# Patient Record
Sex: Female | Born: 1935 | Race: White | Hispanic: No | Marital: Married | Smoking: Former smoker
Health system: Southern US, Community
[De-identification: ages and names within clinical notes are randomized; demographics above are authoritative.]

## PROBLEM LIST (undated history)

## (undated) DIAGNOSIS — R112 Nausea with vomiting, unspecified: Secondary | ICD-10-CM

## (undated) DIAGNOSIS — Z8719 Personal history of other diseases of the digestive system: Secondary | ICD-10-CM

## (undated) DIAGNOSIS — M545 Low back pain, unspecified: Secondary | ICD-10-CM

## (undated) DIAGNOSIS — R269 Unspecified abnormalities of gait and mobility: Secondary | ICD-10-CM

## (undated) DIAGNOSIS — M797 Fibromyalgia: Secondary | ICD-10-CM

## (undated) DIAGNOSIS — J069 Acute upper respiratory infection, unspecified: Secondary | ICD-10-CM

## (undated) DIAGNOSIS — E669 Obesity, unspecified: Secondary | ICD-10-CM

## (undated) DIAGNOSIS — M1990 Unspecified osteoarthritis, unspecified site: Secondary | ICD-10-CM

## (undated) DIAGNOSIS — E7800 Pure hypercholesterolemia, unspecified: Secondary | ICD-10-CM

## (undated) DIAGNOSIS — C801 Malignant (primary) neoplasm, unspecified: Secondary | ICD-10-CM

## (undated) DIAGNOSIS — I10 Essential (primary) hypertension: Secondary | ICD-10-CM

## (undated) DIAGNOSIS — R0602 Shortness of breath: Secondary | ICD-10-CM

## (undated) DIAGNOSIS — G2581 Restless legs syndrome: Secondary | ICD-10-CM

## (undated) DIAGNOSIS — G8929 Other chronic pain: Secondary | ICD-10-CM

## (undated) DIAGNOSIS — Z9889 Other specified postprocedural states: Secondary | ICD-10-CM

## (undated) DIAGNOSIS — R29898 Other symptoms and signs involving the musculoskeletal system: Principal | ICD-10-CM

## (undated) DIAGNOSIS — E119 Type 2 diabetes mellitus without complications: Secondary | ICD-10-CM

## (undated) DIAGNOSIS — G4730 Sleep apnea, unspecified: Secondary | ICD-10-CM

## (undated) HISTORY — PX: JOINT REPLACEMENT: SHX530

## (undated) HISTORY — DX: Fibromyalgia: M79.7

## (undated) HISTORY — DX: Unspecified abnormalities of gait and mobility: R26.9

## (undated) HISTORY — PX: BREAST CYST EXCISION: SHX579

## (undated) HISTORY — PX: VAGINAL HYSTERECTOMY: SUR661

## (undated) HISTORY — DX: Malignant (primary) neoplasm, unspecified: C80.1

## (undated) HISTORY — PX: REPLACEMENT TOTAL KNEE BILATERAL: SUR1225

## (undated) HISTORY — PX: CARPAL TUNNEL RELEASE: SHX101

## (undated) HISTORY — PX: CHOLECYSTECTOMY: SHX55

## (undated) HISTORY — DX: Other symptoms and signs involving the musculoskeletal system: R29.898

## (undated) HISTORY — PX: BREAST LUMPECTOMY: SHX2

## (undated) HISTORY — DX: Essential (primary) hypertension: I10

## (undated) HISTORY — DX: Obesity, unspecified: E66.9

## (undated) HISTORY — PX: ROTATOR CUFF REPAIR: SHX139

---
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
## 1995-11-16 HISTORY — PX: BREAST EXCISIONAL BIOPSY: SUR124

## 1996-11-15 HISTORY — PX: BREAST EXCISIONAL BIOPSY: SUR124

## 2000-08-01 ENCOUNTER — Encounter: Admission: RE | Admit: 2000-08-01 | Discharge: 2000-08-01 | Payer: Self-pay | Admitting: Geriatric Medicine

## 2000-08-01 ENCOUNTER — Encounter: Payer: Self-pay | Admitting: Geriatric Medicine

## 2001-08-08 ENCOUNTER — Encounter: Admission: RE | Admit: 2001-08-08 | Discharge: 2001-08-08 | Payer: Self-pay | Admitting: Geriatric Medicine

## 2001-08-08 ENCOUNTER — Encounter: Payer: Self-pay | Admitting: Geriatric Medicine

## 2002-12-12 ENCOUNTER — Encounter: Payer: Self-pay | Admitting: Specialist

## 2002-12-19 ENCOUNTER — Encounter: Payer: Self-pay | Admitting: Specialist

## 2002-12-19 ENCOUNTER — Inpatient Hospital Stay (HOSPITAL_COMMUNITY): Admission: RE | Admit: 2002-12-19 | Discharge: 2002-12-24 | Payer: Self-pay | Admitting: Specialist

## 2003-11-20 ENCOUNTER — Encounter: Admission: RE | Admit: 2003-11-20 | Discharge: 2003-11-20 | Payer: Self-pay | Admitting: Geriatric Medicine

## 2003-11-26 ENCOUNTER — Encounter: Admission: RE | Admit: 2003-11-26 | Discharge: 2003-11-26 | Payer: Self-pay | Admitting: Rheumatology

## 2004-09-30 ENCOUNTER — Encounter: Admission: RE | Admit: 2004-09-30 | Discharge: 2004-12-29 | Payer: Self-pay | Admitting: Geriatric Medicine

## 2005-02-10 ENCOUNTER — Encounter: Admission: RE | Admit: 2005-02-10 | Discharge: 2005-02-10 | Payer: Self-pay | Admitting: Geriatric Medicine

## 2005-08-25 ENCOUNTER — Ambulatory Visit (HOSPITAL_COMMUNITY): Admission: RE | Admit: 2005-08-25 | Discharge: 2005-08-25 | Payer: Self-pay | Admitting: Gastroenterology

## 2007-10-16 HISTORY — PX: SHOULDER OPEN ROTATOR CUFF REPAIR: SHX2407

## 2007-10-25 ENCOUNTER — Ambulatory Visit (HOSPITAL_COMMUNITY): Admission: RE | Admit: 2007-10-25 | Discharge: 2007-10-26 | Payer: Self-pay | Admitting: Specialist

## 2010-01-14 ENCOUNTER — Inpatient Hospital Stay (HOSPITAL_COMMUNITY): Admission: RE | Admit: 2010-01-14 | Discharge: 2010-01-17 | Payer: Self-pay | Admitting: Specialist

## 2010-11-15 HISTORY — PX: CATARACT EXTRACTION W/ INTRAOCULAR LENS  IMPLANT, BILATERAL: SHX1307

## 2011-02-03 LAB — CBC
HCT: 37.8 % (ref 36.0–46.0)
Hemoglobin: 12.7 g/dL (ref 12.0–15.0)
MCHC: 33.6 g/dL (ref 30.0–36.0)
RDW: 15.1 % (ref 11.5–15.5)

## 2011-02-03 LAB — URINALYSIS, ROUTINE W REFLEX MICROSCOPIC
Glucose, UA: NEGATIVE mg/dL
Hgb urine dipstick: NEGATIVE
Protein, ur: NEGATIVE mg/dL
Specific Gravity, Urine: 1.009 (ref 1.005–1.030)
Urobilinogen, UA: 0.2 mg/dL (ref 0.0–1.0)
pH: 6.5 (ref 5.0–8.0)

## 2011-02-03 LAB — COMPREHENSIVE METABOLIC PANEL
AST: 19 U/L (ref 0–37)
BUN: 20 mg/dL (ref 6–23)
CO2: 30 mEq/L (ref 19–32)
GFR calc Af Amer: 52 mL/min — ABNORMAL LOW (ref >60)
Glucose, Bld: 94 mg/dL (ref 70–99)

## 2011-02-03 LAB — TYPE AND SCREEN: ABO/RH(D): A POS

## 2011-02-03 LAB — DIFFERENTIAL
Basophils Absolute: 0 10*3/uL (ref 0.0–0.1)
Eosinophils Absolute: 0.1 10*3/uL (ref 0.0–0.7)
Eosinophils Relative: 2 % (ref 0–5)
Lymphs Abs: 1.9 10*3/uL (ref 0.7–4.0)
Monocytes Absolute: 0.6 10*3/uL (ref 0.1–1.0)
Monocytes Relative: 7 % (ref 3–12)
Neutro Abs: 5.8 10*3/uL (ref 1.7–7.7)
Neutrophils Relative %: 69 % (ref 43–77)

## 2011-02-03 LAB — APTT: aPTT: 27 seconds (ref 24–37)

## 2011-02-07 LAB — GLUCOSE, CAPILLARY
Glucose-Capillary: 102 mg/dL — ABNORMAL HIGH (ref 70–99)
Glucose-Capillary: 108 mg/dL — ABNORMAL HIGH (ref 70–99)
Glucose-Capillary: 127 mg/dL — ABNORMAL HIGH (ref 70–99)
Glucose-Capillary: 133 mg/dL — ABNORMAL HIGH (ref 70–99)
Glucose-Capillary: 146 mg/dL — ABNORMAL HIGH (ref 70–99)

## 2011-02-07 LAB — BASIC METABOLIC PANEL
BUN: 10 mg/dL (ref 6–23)
CO2: 31 mEq/L (ref 19–32)
Calcium: 8.7 mg/dL (ref 8.4–10.5)
Calcium: 9 mg/dL (ref 8.4–10.5)
Calcium: 9.6 mg/dL (ref 8.4–10.5)
Chloride: 102 mEq/L (ref 96–112)
Chloride: 103 mEq/L (ref 96–112)
GFR calc Af Amer: 59 mL/min — ABNORMAL LOW (ref >60)
GFR calc Af Amer: >60 mL/min (ref >60)
GFR calc non Af Amer: 49 mL/min — ABNORMAL LOW (ref >60)
GFR calc non Af Amer: 52 mL/min — ABNORMAL LOW (ref >60)
GFR calc non Af Amer: 59 mL/min — ABNORMAL LOW (ref >60)
Glucose, Bld: 133 mg/dL — ABNORMAL HIGH (ref 70–99)
Glucose, Bld: 149 mg/dL — ABNORMAL HIGH (ref 70–99)
Potassium: 4.4 mEq/L (ref 3.5–5.1)
Sodium: 137 mEq/L (ref 135–145)

## 2011-02-07 LAB — CBC
HCT: 35.2 % — ABNORMAL LOW (ref 36.0–46.0)
HCT: 35.3 % — ABNORMAL LOW (ref 36.0–46.0)
Hemoglobin: 11.2 g/dL — ABNORMAL LOW (ref 12.0–15.0)
Hemoglobin: 12 g/dL (ref 12.0–15.0)
MCV: 83.3 fL (ref 78.0–100.0)
MCV: 83.9 fL (ref 78.0–100.0)
Platelets: 172 10*3/uL (ref 150–400)
RBC: 4.14 MIL/uL (ref 3.87–5.11)
RDW: 14.9 % (ref 11.5–15.5)
RDW: 15 % (ref 11.5–15.5)
WBC: 10 10*3/uL (ref 4.0–10.5)
WBC: 11.9 10*3/uL — ABNORMAL HIGH (ref 4.0–10.5)
WBC: 9.3 10*3/uL (ref 4.0–10.5)

## 2011-02-07 LAB — PROTIME-INR
INR: 1.09 (ref 0.00–1.49)
INR: 1.72 — ABNORMAL HIGH (ref 0.00–1.49)
INR: 2.3 — ABNORMAL HIGH (ref 0.00–1.49)
Prothrombin Time: 14 seconds (ref 11.6–15.2)
Prothrombin Time: 20 seconds — ABNORMAL HIGH (ref 11.6–15.2)

## 2011-03-30 NOTE — Op Note (Signed)
NAMEPECOLA, HAXTON                ACCOUNT NO.:  [id number]   MEDICAL RECORD NO.:  [id number]          PATIENT TYPE:  AMB   LOCATION:  DAY                          FACILITY:  [facility]   PHYSICIAN:  Jene Every, M.D.    DATE OF BIRTH:  04-29-1936   DATE OF PROCEDURE:  10/25/2007  DATE OF DISCHARGE:                               OPERATIVE REPORT   PREOPERATIVE DIAGNOSES:  1. Recurrent rotator cuff tear.  2. Glenohumeral arthrosis of the right shoulder.   POSTOPERATIVE DIAGNOSES:  1. Recurrent rotator cuff tear.  2. Glenohumeral arthrosis of the right shoulder.   PROCEDURES PERFORMED:  1. Redo open repair rotator cuff tear.  2. Lavage of the glenohumeral joint.  3. Acromioplasty.  4. Excision of scar tissue and revision of previous surgical scar.   ANESTHESIA:  General.   ASSISTANT:  Roma Schanz, P.A.   BRIEF HISTORY AND INDICATIONS:  A 75 year old with recurrent tear of the  rotator cuff noted by MRI with positive impingement sign and pain  refractory to conservative treatment.  She was indicated for redo repair  of the rotator cuff.  She has also had a distal clavicle resection in  the past.  The risks and benefits discussed, including bleeding,  infection, suboptimal range of motion, recurrent tear, need for  prolonged immobilization, perioperative complications, etc.   TECHNIQUE:  The patient supine in beach-chair position.  After induction  of adequate anesthesia and 2 g Kefzol, the right upper extremity was  prepped and draped in the usual sterile fashion.  Previous surgical  incision, which was lateral, was utilized and excised the previous  hypertrophic scar.  Subcutaneous tissues were dissected.  Electrocautery  was utilized to achieve hemostasis.  The raphe was within anterolateral  heads of the deltoid was identified and divided approximately 2 cm from  the anterolateral aspect of the acromion and subperiosteally elevated  laterally and slightly medially,  preserving the remainder of the deltoid  attachment.  Significant hypertrophic scar tissue and bursitis were  noted.  This was excised utilizing the Metzenbaums.  A tear of the  supraspinatus was noted longitudinal  to its insertion with degenerated  tendon as well.  This was elliptically excised.  It was debrided and a  small  area of decortication of the bone was performed a Investment banker, operational.  Following this, I copiously lavaged the glenohumeral joint, rotating the  arm and expressing all fluid utilized.  Good bleeding ends of the tissue  were noted.  It was attached distally and repaired side-to-side with #1  Vicryl interrupted figure-of-eight sutures.  The subcutaneous tissue was  reapproximated with 2-0 simple sutures.  The #1 Vicryl was utilized. To  repair the raphe through and to the acromion.  The skin was  reapproximated with 4-0 subcuticular Prolene.  The wound was reinforced  with Steri-Strips, sterile dressing applied.  I ranged the shoulder  prior to final closure and there was no  tension upon that  repair site.  Following extubation, she was placed in  the sling, returned to the recovery room in satisfactory condition.   The patient  tolerated the procedure well.  There were no complications.      Jene Every, M.D.  Electronically Signed     JB/MEDQ  D:  10/25/2007  T:  10/25/2007  Job:  [id number]

## 2011-04-02 NOTE — Discharge Summary (Signed)
[name], [name]                          ACCOUNT NO.:  [id number]   MEDICAL RECORD NO.:  [id number]                   PATIENT TYPE:  INP   LOCATION:  0481                                 FACILITY:  [facility]   PHYSICIAN:  Jene Every, M.D.                 DATE OF BIRTH:  June 21, 1936   DATE OF ADMISSION:  12/19/2002  DATE OF DISCHARGE:  12/24/2002                                 DISCHARGE SUMMARY   ADMISSION DIAGNOSES:  1. Degenerative joint disease left knee.  2. Diabetes mellitus type 2.  3. Hypertension.  4. Hypercholesterolemia.  5. Osteoarthritis.   DISCHARGE DIAGNOSES:  1. Degenerative joint disease left knee status post left total knee     arthroplasty.  2. Diabetes mellitus type 2.  3. Hypertension.  4. Hypercholesterolemia.  5. Osteoarthritis.   PROCEDURE:  The patient was taken to the OR on December 19, 2002 to undergo a  left total knee arthroplasty by Jene Every, M.D.  Assistant Roma Schanz, P.A.  Anesthesia:  General.  Complications:  None.  At the time of  surgery one Hemovac drain was placed.   CONSULTS:  PT/OT.   HISTORY:  The patient is a 75 year old female with long-standing history of  left knee pain.  Was described very debilitating over the past several  months.  The patient has tried multiple conservative treatments including  arthroscopic surgery, corticosteroid injection, anti-inflammatories, and has  not gotten significant relief from these.  Due to the patient's physical  findings and failing conservative treatment, it is felt she would benefit  from a total knee arthroplasty.  The risks and benefits of the surgery were  discussed with the patient by Jene Every, M.D. and she wished to proceed.   LABORATORIES:  Pre admission CBC shows a white blood cell count of 7.4,  hemoglobin 12.8, hematocrit 37.0.  Serial hemoglobin and hematocrit were  followed throughout the hospital course.  The patient did drop to a level of  10.3,  hematocrit 30.0 at time of discharge, but remained asymptomatic.  Differential were all within normal limits.  PT/INR was followed.  Pre  admission PT was 13.2, INR 1.0.  At the time of discharge the PT/INR was  therapeutic.  PT 22.3, INR 2.2.  Routine chemistries were also done  preoperatively showed sodium 143, potassium 4.1, glucose 122, BUN and  creatinine normal.  At time of discharge patient's chemistries remained  within normal limits with sodium 140, potassium 3.6, glucose 136, slightly  decreased in her calcium to 8.3.  Urinalysis done preoperatively shows  cloudy urine with large amount of leukocyte esterase, many epithelials, 11-  20 wbc's, serum microscopic examination with many bacteria.  Several  urinalyses were performed throughout the patient's hospital course which  continued to show moderate leukocyte esterase with many epithelials with  only 3-6 wbc's seen on microscopic examination.  The patient remained  symptomatic from these  urinary results.  Blood type is A+.  Urine culture  was performed during hospitalization which showed no growth.  Pre admission  EKG showed normal sinus rhythm with anterior infarct of age undetermined.  Chest x-ray shows cardiomegaly with no active cardiopulmonary disease noted.  Preoperative films of the knee show a medial compartment osteoarthritis.   HOSPITAL COURSE:  The patient was subsequently admitted to [facility].  Underwent the above stated procedure without difficulty.  During  surgery one Hemovac drain was placed.  The patient was transferred to the  PACU and then to the orthopedic floor for continued postoperative care.  The  patient was placed on PC analgesics for pain control.  PT/OT was ordered  with ambulation as tolerated.  Postoperative day number one the patient was  doing fairly well.  She did note some mild nausea but this was relieved with  antiemetics.  Motor and neurovascular function remained intact.  Hemovac  was  continued.  The patient had 100 mL of drainage.  Coumadin was initiated per  the pharmacy for DVT prophylaxis.  PT/OT were consulted.  CPM was initiated.  Postoperative day number two pain was well controlled on p.o. medications.  The patient's Foley/PCA were discontinued.  IV was changed to Hep-Lock.  Urinalysis was repeated.  However, the patient remained asymptomatic.  Dressing was changed.  Incision was clean and dry.  Hemovac was removed  without difficulty.  Rehabilitation consult was requested.  However, no beds  were available.  Postoperative day number three patient was requesting to be  discharged.  She was advancing well with PT.  Repeat urinalysis was  performed for a clean catch.  This showed only moderate leukocyte esterase  with only a few wbc's seen on microscopic examination.  Postoperative day  number five the patient was doing fairly well.  She was advancing very well  with physical therapy who felt she was ready to be discharged home.  Discharge planning had been initiated with all her home health needed.  Hemoglobin was at 10.3, hematocrit 30.0.  PT/INR was therapeutic at 22.3,  2.2.  Urine culture was negative.  Incision remained clean and dry without  evidence of infection.  Pain was well controlled on p.o. analgesics.  At  this point it was felt that patient could be discharged home.   DISCHARGE INSTRUCTIONS:  Follow up with Jene Every, M.D. in 10-14 days.  Call the office for an appointment.  She should change her dressing daily.  She can shower.  Ambulation as tolerated.  Knee immobilizer at all times  until patient can straight leg raise.   DISCHARGE MEDICATIONS:  1. Percocet 5/325 one to two p.o. q.4-6h. p.r.n. pain.  2. Robaxin 500 mg one p.o. q.8h. p.r.n. spasm.  3. Coumadin per the pharmacy.   CONDITION ON DISCHARGE:  Stable.   FINAL DIAGNOSES:  Status post left total knee arthroplasty.     Roma Schanz, P.A.                   Jene Every, M.D.    CS/MEDQ  D:  01/16/2003  T:  01/16/2003  Job:  [id number]

## 2011-04-02 NOTE — H&P (Signed)
NAME:  Kayla, Crawford                           ACCOUNT NO.:  [id number]   MEDICAL RECORD NO.:  [id number]                    PATIENT TYPE:   LOCATION:                                       FACILITY:   PHYSICIAN:  Jene Every, M.D.                 DATE OF BIRTH:  09/30/1936   DATE OF ADMISSION:  12/19/2002  DATE OF DISCHARGE:                                HISTORY & PHYSICAL   CHIEF COMPLAINT:  Left knee pain.   HISTORY OF PRESENT ILLNESS:  Ms. [name] is a 75 year old female who has a long  standing history of left knee pain that has gotten to the point where it is  very debilitating. The patient has tried conservative treatment such as  arthroscopic surgery times two, corticosteroid injections, anti-  inflammatories and has not gotten significant relief from any of these. So  it is felt that at this time, the patient would benefit from a total knee  arthroplasty due to the escalation of her symptoms and it becoming  unbearable. The risks and benefits of this surgery were discussed with the  patient by Dr. Jene Every and she wishes to proceed.   PAST MEDICAL HISTORY:  Significant for diabetes mellitus type II,  hypertension, hypercholesterolemia, osteoarthritis.   PAST SURGICAL HISTORY:  Significant for appendectomy, cholecystectomy,  partial hysterectomy, bilateral knee scope, right rotator cuff repair.   CURRENT MEDICATIONS:  1. Avandia 8 mg one by mouth each day.  2. Toprol XL 50 mg one by mouth each day.  3. Lisinopril 20 mg one by mouth twice a day.  4. Lipitor 40 mg one by mouth each day.  5. Mobic one by mouth each day.  6. Ultram as needed pain.   ALLERGIES:  No known drug allergies.   SOCIAL HISTORY:  The patient is married. She smokes about 1 pack per day of  cigarettes. She denies any alcohol intake. Her husband will be her caregiver  following surgery. They live in a one story home. Primary care physician is  Dr. Merlene Laughter.   FAMILY HISTORY:   Noncontributory. The patient was adopted as a child.   REVIEW OF SYSTEMS:  General: The patient denies fever, chills, night sweats,  or bleeding tendencies. CNS: No blurred, double vision, seizure, headache,  or paralysis. Respiratory: No shortness of breath, cough, or hemoptysis.  Cardiovascular: No angina or orthopnea. GI: No nausea, vomiting, diarrhea,  constipation, melena, blood in stools. GU: No dysuria, hematuria or  discharge. MS: Is pertinent as per HPI.   PHYSICAL EXAMINATION:  VITAL SIGNS: Pulse 88, respiratory rate 18, blood  pressure 138/96.  GENERAL: A well developed, well nourished 75 year old female in no acute  distress.  HEENT: Atraumatic, normocephalic. Pupils are equal, round, and reactive to  light and accommodation. Extraocular muscles intact.  NECK: Supple. No lymphadenopathy. No carotid bruits are appreciated.  CHEST: Clear to auscultation and  percussion bilaterally. No rhonchi,  wheezes, or rales.  HEART: Regular rate and rhythm. No murmur, rub, or gallop.  ABDOMEN: Soft, nontender, and nondistended. Bowel sounds times four.  BREASTS: Not examined.  GU: Not examined. Not pertinent to HPI.  EXTREMITIES: Examination of left knee along medial joint line. She has a  range of motion of 0-100 degrees.   DIAGNOSTIC IMPRESSION:  X-ray's of the left knee show medial joint space  narrowing.   IMPRESSION:  1. Degenerative joint disease of the left knee.  2. Diabetes mellitus type II.  3. Hypertension.  4. Hypercholesterolemia.  5. Osteoarthritis.   PLAN:  The patient will be admitted to [facility] on  December 19, 2002 to undergo a left total knee arthroplasty pending medical  clearance by Dr. Pete Glatter.      Roma Schanz, P.A.                   Jene Every, M.D.    CS/MEDQ  D:  12/13/2002  T:  12/13/2002  Job:  [id number]   cc:   Jene Every, M.D.  [address]  [place]  [place] [address]  Fax: [phone]   Hal T. Stoneking,  M.D.  [address] [place], [place] [address]  Fax: [phone]

## 2011-04-02 NOTE — Op Note (Signed)
NAMERYKER, PHERIGO                          ACCOUNT NO.:  [id number]   MEDICAL RECORD NO.:  [id number]                   PATIENT TYPE:  INP   LOCATION:  X001                                 FACILITY:  [facility]   PHYSICIAN:  Jene Every, M.D.                 DATE OF BIRTH:  1936-10-11   DATE OF PROCEDURE:  12/19/2002  DATE OF DISCHARGE:                                 OPERATIVE REPORT   PREOPERATIVE DIAGNOSIS:  Degenerative joint disease, left knee.   POSTOPERATIVE DIAGNOSIS:  Degenerative joint disease, left knee.   OPERATION PERFORMED:  Left total knee arthroplasty.   SURGEON:  Jene Every, M.D.   ASSISTANT:  Roma Schanz, P.A.   ANESTHESIA:  General.   COMPONENTS:  Osteomed posterior cruciate sacrificing 7 femur, 5 tibia, 15 mm  insert, 26 patella.   INDICATIONS FOR PROCEDURE:  The patient is a 75 year old with refractory  knee pain secondary to end stage osteoporosis particularly the medial  compartment refractory to conservative treatment.  Operative intervention is  indicated for replacement of degenerated joint.  Risks and benefits were  discussed including bleeding, infection, damage to neurovascular structures,  suboptimal range of motion, need for revision, hardware failure, hardware  rejection, etc.   DESCRIPTION OF PROCEDURE:  Patient in supine position.  After adequate level  of general anesthesia, 1 g Kefzol, the left lower extremity was prepped and  draped and exsanguinated in the usual sterile fashion.  Thigh tourniquet was  inflated to 350 mmHg.  Midline incision was made over the anterior aspect of  the knee extending distally just medial to the tibial tubercle.  Full  thickness flap was developed.  Skin was everted.  Parapatellar arthrotomy  was performed.  Synovial fluid was evacuated, patella everted and flexed,  patellofemoral ligament was divided.  Looking at the remnant of the ACL, the  ACL was found to be dysfunctional.  Removed osteophytes  with a rongeur.  Tricompartmental severe osteoarthrosis was noted.  Removed the medial and  lateral meniscus.  Step drill was utilized down into the femur.  It was  irrigated, evacuated.  Extramedullary guide placed 5 degrees left.  Transection of the distal femur, 10 mm cut was performed.  After jig was  applied, soft tissue was protected.  This was sized with a sizer to be a 7.  The distal femoral cutting jig was then applied dissecting the intercondylar  notch.  The anterior, posterior and Chamfer cuts were performed and soft  tissue well protected.  The tibial was subluxed with McHales, residual  meniscus was removed.  Geniculate arteries were cauterized.  Oscillating saw  then utilized to remove the tibial spine.  With the bushing applied and  sized to a 5, we entered the canal, step drilled, irrigated it, placed  intramedullary guide, fabricated the posterior slope transfixing the  appropriate rotation medial to the tibial tubercle, bisecting the talus.  Was  able to take four off the defect medially.  This was with a stylet.  Oscillating saw utilized to remove the proximal tibial cut.  Next, the trial  femur and tibia was placed in flexion and extension.  The appropriate  rotation was marked with an external alignment guide, again bisecting the  talus medial to the tibial tubercle parallel to the tibia.  After this was  marked, the knee was flexed, it was subluxed and the baseplate applied and  the appropriate rotation pinned.  Punch guides were then performed.  We  turned attention back to the femur.  Again the ACL and PCL was nonfunctional  and therefore transfixed to the femur.  Punch guide to remove the box on the  intercondylar notch.  Remnant of the PCL was removed as well.  Next, the  patella was everted, sized to a 26 clamped, drilled, 10 mm in depth.  Peg  hole was drilled as well.  Next, ossification was noted and removed, knee  was copiously irrigated.  We felt posteriorly  and there were no residual  osteophytes noted.  Copiously irrigated with pulsatile lavage.  Following  this, the knee was then flexed, dried, cement was mixed on the back table in  appropriate usual fashion.  Placed Gelfoam in the tibial canal to prevent  extrusion of the cement.  Placed cement on the tibia, impacted the tibia, #5  into the tibia, removed the redundant cement, cemented a 7 femur onto the  femur.  Cement on the posterior __________, redundant cement removed.  Reduced it with a spacer 12 mm, __________ with axial load applied and  cemented the patellar component to the patella and held it with a clamp.  Cement was cured.  Instruments were removed.  We removed redundant cement  throughout the knee, copiously irrigated it, trialed optimally we tried a 12  and a 15.  It was noticed varus and valgus stability with a 15.  The knee  had good flexion at 120 degrees with no lift off.  We selected a 15 subluxed  the tibia, cleaned the tibial tray, impacted the 15, reduced it and again  trialed without change.  Wound copiously irrigated again, placed Hemovac  brought out through lateral stab wound in the skin.  Marcaine with  epinephrine was infiltrated in the joint, closed patellar arthrotomy with #1  Vicryl interrupted figure-of-eight sutures, subcutaneous tissue  reapproximated with 0 and 2-0 Vicryl subcuticular and the skin was  reapproximated with staples and dressed sterilely.  Secured with an Ace  bandage.  Tourniquet was deflated and there was adequate vascularization of  lower extremity peripherally.   The patient tolerated the procedure well without complication.   TOURNIQUET TIME:  1 hour and 15 minutes.                                               Jene Every, M.D.    Cordelia Pen  D:  12/19/2002  T:  12/19/2002  Job:  [id number]

## 2011-04-02 NOTE — Op Note (Signed)
NAMENEMESIS, RAINWATER                ACCOUNT NO.:  [id number]   MEDICAL RECORD NO.:  [id number]          PATIENT TYPE:  AMB   LOCATION:  ENDO                         [facility]ACILITY[facility]  [facility]   PHYSICIAN:  Danise Edge, M.D.   DATE OF BIRTH:  [birth year]   DATE OF PROCEDURE:  08/25/2005  DATE OF DISCHARGE:                                 OPERATIVE REPORT   PROCEDURE:  Screening colonoscopy.   REFERRING PHYSICIAN:  Hal T. Stoneking, M.D.   PROCEDURE INDICATIONS:  Ms. Orma Cheetham is a 75 year old female born 12-Jul-1936. Ms. Iwata is scheduled to undergo her first screening colonoscopy  with polypectomy to prevent colon cancer.   ENDOSCOPIST:  Danise Edge, M.D.   PREMEDICATION:  Versed 7 mg, Demerol 50 mg.   DESCRIPTION OF PROCEDURE:  After obtaining informed consent, Ms. Stiverson was  placed in the left lateral decubitus position. I administered intravenous  Demerol and intravenous Versed to achieve conscious sedation for the  procedure. The patient's blood pressure, oxygen saturation and cardiac  rhythm were monitored throughout the procedure and documented in the medical  record.   Anal inspection and digital rectal exam were normal. The Olympus adjustable  pediatric colonoscope was introduced into the rectum and advanced to the  cecum. A normal-appearing appendiceal orifice and ileocecal valve were  identified. Colonic preparation for the exam today was excellent.   RECTUM:  Normal. Retroflex view of the distal rectum normal.  SIGMOID COLON AND DESCENDING COLON:  Normal.  SPLENIC FLEXURE:  Normal.  TRANSVERSE COLON:  Normal.  HEPATIC FLEXURE:  Normal.  ASCENDING COLON:  Normal.  CECUM AND ILEOCECAL VALVE:  Normal.   ASSESSMENT:  Normal screening proctocolonoscopy to the cecum.           ______________________________  Danise Edge, M.D.    MJ/MEDQ  D:  08/25/2005  T:  08/25/2005  Job:  [id number]   cc:   Hal T. Stoneking, M.D.  Fax: [phone]

## 2011-08-23 LAB — BASIC METABOLIC PANEL
BUN: 20
Calcium: 8.7
Creatinine, Ser: 1.29 — ABNORMAL HIGH
GFR calc non Af Amer: 41 — ABNORMAL LOW
Glucose, Bld: 165 — ABNORMAL HIGH
Potassium: 4.1

## 2011-08-23 LAB — CBC
HCT: 33 — ABNORMAL LOW
HCT: 39.9
Platelets: 203
Platelets: 266
RBC: 4.69
RDW: 16 — ABNORMAL HIGH
WBC: 10.1

## 2011-08-23 LAB — COMPREHENSIVE METABOLIC PANEL
AST: 43 — ABNORMAL HIGH
Albumin: 3.9
Alkaline Phosphatase: 49
BUN: 22
CO2: 29
Chloride: 104
GFR calc Af Amer: 42 — ABNORMAL LOW
Potassium: 4.4
Total Bilirubin: 1

## 2011-08-23 LAB — DIFFERENTIAL
Basophils Absolute: 0
Basophils Relative: 0
Eosinophils Absolute: 0.1 — ABNORMAL LOW
Eosinophils Relative: 1
Monocytes Absolute: 0.7

## 2011-08-23 LAB — URINALYSIS, ROUTINE W REFLEX MICROSCOPIC
Bilirubin Urine: NEGATIVE
Bilirubin Urine: NEGATIVE
Hgb urine dipstick: NEGATIVE
Ketones, ur: NEGATIVE
Ketones, ur: NEGATIVE
Nitrite: NEGATIVE
Protein, ur: NEGATIVE
Specific Gravity, Urine: 1.025
Urobilinogen, UA: 0.2
Urobilinogen, UA: 0.2

## 2011-08-23 LAB — URINE MICROSCOPIC-ADD ON

## 2011-11-16 HISTORY — PX: BREAST BIOPSY: SHX20

## 2012-01-04 ENCOUNTER — Other Ambulatory Visit: Payer: Self-pay | Admitting: Internal Medicine

## 2012-01-04 DIAGNOSIS — N6310 Unspecified lump in the right breast, unspecified quadrant: Secondary | ICD-10-CM

## 2012-01-10 ENCOUNTER — Ambulatory Visit
Admission: RE | Admit: 2012-01-10 | Discharge: 2012-01-10 | Disposition: A | Discharge status: Home / Self Care | Payer: Medicare Other | Source: Ambulatory Visit | Attending: Internal Medicine | Admitting: Internal Medicine

## 2012-01-10 ENCOUNTER — Ambulatory Visit
Admission: RE | Admit: 2012-01-10 | Discharge: 2012-01-10 | Disposition: A | Discharge status: Home / Self Care | Payer: Self-pay | Source: Ambulatory Visit | Attending: Internal Medicine | Admitting: Internal Medicine

## 2012-01-10 DIAGNOSIS — N6310 Unspecified lump in the right breast, unspecified quadrant: Secondary | ICD-10-CM

## 2012-01-11 ENCOUNTER — Other Ambulatory Visit: Payer: Self-pay | Admitting: Internal Medicine

## 2012-01-11 DIAGNOSIS — C50911 Malignant neoplasm of unspecified site of right female breast: Secondary | ICD-10-CM

## 2012-01-13 ENCOUNTER — Telehealth: Payer: Self-pay | Admitting: *Deleted

## 2012-01-13 ENCOUNTER — Other Ambulatory Visit: Payer: Self-pay | Admitting: *Deleted

## 2012-01-13 DIAGNOSIS — C50419 Malignant neoplasm of upper-outer quadrant of unspecified female breast: Secondary | ICD-10-CM

## 2012-01-13 DIAGNOSIS — C50411 Malignant neoplasm of upper-outer quadrant of right female breast: Secondary | ICD-10-CM | POA: Insufficient documentation

## 2012-01-13 NOTE — Telephone Encounter (Signed)
Confirmed [facility] for 01/19/12 at 0800 .  Instructions and contact information given.  

## 2012-01-14 ENCOUNTER — Ambulatory Visit
Admission: RE | Admit: 2012-01-14 | Discharge: 2012-01-14 | Disposition: A | Discharge status: Home / Self Care | Payer: Medicare Other | Source: Ambulatory Visit | Attending: Internal Medicine | Admitting: Internal Medicine

## 2012-01-14 DIAGNOSIS — C50911 Malignant neoplasm of unspecified site of right female breast: Secondary | ICD-10-CM

## 2012-01-14 MED ORDER — GADOBENATE DIMEGLUMINE 529 MG/ML IV SOLN
10.0000 mL | Freq: Once | INTRAVENOUS | Status: AC | PRN
  Administered 2012-01-14: 10 mL via INTRAVENOUS

## 2012-01-19 ENCOUNTER — Ambulatory Visit: Payer: Medicare Other | Attending: Surgery | Admitting: Physical Therapy

## 2012-01-19 ENCOUNTER — Ambulatory Visit (HOSPITAL_BASED_OUTPATIENT_CLINIC_OR_DEPARTMENT_OTHER): Payer: Medicare Other | Admitting: Oncology

## 2012-01-19 ENCOUNTER — Ambulatory Visit
Admission: RE | Admit: 2012-01-19 | Discharge: 2012-01-19 | Disposition: A | Discharge status: Home / Self Care | Payer: Medicare Other | Source: Ambulatory Visit | Attending: Radiation Oncology | Admitting: Radiation Oncology

## 2012-01-19 ENCOUNTER — Ambulatory Visit (HOSPITAL_BASED_OUTPATIENT_CLINIC_OR_DEPARTMENT_OTHER): Payer: Medicare Other | Admitting: Surgery

## 2012-01-19 ENCOUNTER — Other Ambulatory Visit (INDEPENDENT_AMBULATORY_CARE_PROVIDER_SITE_OTHER): Payer: Self-pay | Admitting: Surgery

## 2012-01-19 ENCOUNTER — Encounter (INDEPENDENT_AMBULATORY_CARE_PROVIDER_SITE_OTHER): Payer: Self-pay | Admitting: Surgery

## 2012-01-19 ENCOUNTER — Ambulatory Visit: Payer: Medicare Other

## 2012-01-19 ENCOUNTER — Encounter: Payer: Self-pay | Admitting: Oncology

## 2012-01-19 ENCOUNTER — Other Ambulatory Visit (HOSPITAL_BASED_OUTPATIENT_CLINIC_OR_DEPARTMENT_OTHER): Payer: Medicare Other | Admitting: Lab

## 2012-01-19 VITALS — BP 178/92 | HR 74 | Temp 97.6°F | Resp 18 | Wt 282.0 lb

## 2012-01-19 VITALS — BP 178/92 | HR 74 | Temp 97.6°F | Ht 61.5 in | Wt 282.0 lb

## 2012-01-19 DIAGNOSIS — IMO0001 Reserved for inherently not codable concepts without codable children: Secondary | ICD-10-CM | POA: Insufficient documentation

## 2012-01-19 DIAGNOSIS — R293 Abnormal posture: Secondary | ICD-10-CM | POA: Insufficient documentation

## 2012-01-19 DIAGNOSIS — C50419 Malignant neoplasm of upper-outer quadrant of unspecified female breast: Secondary | ICD-10-CM

## 2012-01-19 DIAGNOSIS — C50919 Malignant neoplasm of unspecified site of unspecified female breast: Secondary | ICD-10-CM | POA: Insufficient documentation

## 2012-01-19 LAB — COMPREHENSIVE METABOLIC PANEL
Alkaline Phosphatase: 52 U/L (ref 39–117)
BUN: 34 mg/dL — ABNORMAL HIGH (ref 6–23)
CO2: 31 mEq/L (ref 19–32)
Creatinine, Ser: 1.23 mg/dL — ABNORMAL HIGH (ref 0.50–1.10)
Glucose, Bld: 118 mg/dL — ABNORMAL HIGH (ref 70–99)
Sodium: 137 mEq/L (ref 135–145)
Total Bilirubin: 0.4 mg/dL (ref 0.3–1.2)
Total Protein: 6.9 g/dL (ref 6.0–8.3)

## 2012-01-19 LAB — CBC WITH DIFFERENTIAL/PLATELET
Basophils Absolute: 0 10*3/uL (ref 0.0–0.1)
Eosinophils Absolute: 0.2 10*3/uL (ref 0.0–0.5)
HCT: 39.5 % (ref 34.8–46.6)
LYMPH%: 23.2 % (ref 14.0–49.7)
MCV: 80.6 fL (ref 79.5–101.0)
MONO#: 0.5 10*3/uL (ref 0.1–0.9)
MONO%: 6.4 % (ref 0.0–14.0)
NEUT#: 5.8 10*3/uL (ref 1.5–6.5)
NEUT%: 67.5 % (ref 38.4–76.8)
Platelets: 239 10*3/uL (ref 145–400)
RBC: 4.9 10*6/uL (ref 3.70–5.45)
WBC: 8.5 10*3/uL (ref 3.9–10.3)

## 2012-01-19 NOTE — Progress Notes (Signed)
[facility] Radiation Oncology NEW PATIENT EVALUATION  Name: [name] MRN: [id number]  Date: 01/19/2012  DOB: 01/14/1936  Status: outpatient   CC: HOFFMAN,BYRON, MD, MD  Currie Paris, MD , Dr. Drue Second   REFERRING PHYSICIAN: Jamey Ripa Reola Mosher, MD   DIAGNOSIS: Clinical stage IA (T1, N0, M0) invasive ductal carcinoma of the right breast     HISTORY OF PRESENT ILLNESS:  [name] is a 76 y.o. female who is seen today for evaluation of her clinical stage IA (T1, N0, M0) invasive ductal carcinoma of the right breast. At the time of a screening mammogram at [facility] [place] [facility] on 12/24/2011 she was felt to have a possible mass within the right breast. Additional views and ultrasound on 01/04/2012 showed a mass within the upper-outer quadrant suspicious for malignancy. This measured 0.7 x 0.6 x 1.0 cm on ultrasound. [facility]2012 revealed invasive ductal carcinoma which was strongly ER/PR positive with a low proliferation marker of 15%. She has multiple comorbidities related to obesity , hypertension, fibromyalgia. She is status post multiple orthopedic surgeries including right and left knee replacement surgery.   PREVIOUS RADIATION THERAPY: No   PAST MEDICAL HISTORY:  has a past medical history of Hypertension; Diabetes mellitus; and Fibromyalgia.     PAST SURGICAL HISTORY:  Past Surgical History  Procedure Date  . Abdominal hysterectomy   . Knee replacements     both right and left knees  . Rotator cuff sugery     bilateral  . Breast cyst removal     benign  76 years old  . Appendectomy   . Cholecystectomy circa 1978    Open - done in [place]     FAMILY HISTORY: family history is not on file. Her father died from cardiac disease at age 32. Her mother died from cardiac disease at age 69.  SOCIAL HISTORY:  reports that she quit smoking about 6 years ago. She does not have any smokeless tobacco  history on file. She reports that she does not drink alcohol or use illicit drugs. Married with 2 children. She works as an Banker shows.   ALLERGIES: Review of patient's allergies indicates no known allergies.   MEDICATIONS:  Current Outpatient Prescriptions  Medication Sig Dispense Refill  . aspirin 81 MG tablet Take 81 mg by mouth daily.      . calcium carbonate (OS-CAL) 600 MG TABS Take 600 mg by mouth 2 (two) times daily with a meal.      . doxycycline (VIBRAMYCIN) 100 MG capsule Take 100 mg by mouth 2 (two) times daily.      . furosemide (LASIX) 20 MG tablet Take 20 mg by mouth 2 (two) times daily.      Marland Kitchen HYDROcodone-acetaminophen (VICODIN) 5-500 MG per tablet Take 1 tablet by mouth every 6 (six) hours as needed.      Marland Kitchen lisinopril (PRINIVIL,ZESTRIL) 20 MG tablet Take 60 mg by mouth daily.      . meloxicam (MOBIC) 7.5 MG tablet Take 7.5 mg by mouth daily.      . metFORMIN (GLUCOPHAGE) 1000 MG tablet Take 1,000 mg by mouth 2 (two) times daily with a meal.      . metoprolol tartrate (LOPRESSOR) 25 MG tablet Take 25 mg by mouth 2 (two) times daily.      Marland Kitchen rOPINIRole (REQUIP) 2 MG tablet Take 2 mg by mouth 3 (three) times daily.      Marland Kitchen  simvastatin (ZOCOR) 40 MG tablet Take 40 mg by mouth every evening.      . solifenacin (VESICARE) 5 MG tablet Take 10 mg by mouth daily.      . traZODone (DESYREL) 50 MG tablet Take 50 mg by mouth at bedtime.          REVIEW OF SYSTEMS:  Pertinent items are noted in HPI.    PHYSICAL EXAM: Alert and oriented obese 76 year old white female appearing her stated age. Vital signs: BP 178/92, P. 74, RR 20, temperature 97.6 Head and neck examination: Grossly unremarkable. Nodes: Without palpable cervical, supraclavicular, or axillary lymphadenopathy. Chest: Lungs clear. Heart: Regular rhythm. Breasts: There is area of ecchymosis along the lateral right breast at approximately 9 to 10:00. I would I can feel a 1.0 cm  or mass along the  posterior area of ecchymosis, but this could represent biopsy change/hematoma. No other masses are appreciated. Left breast without masses or lesions. Abdomen obese without masses organomegaly. Extremities without edema.     LABORATORY DATA:  Lab Results  Component Value Date   WBC 8.5 01/19/2012   HGB 13.2 01/19/2012   HCT 39.5 01/19/2012   MCV 80.6 01/19/2012   PLT 239 01/19/2012   Lab Results  Component Value Date   NA 137 01/19/2012   K 4.3 01/19/2012   CL 100 01/19/2012   CO2 31 01/19/2012   Lab Results  Component Value Date   ALT 15 01/19/2012   AST 15 01/19/2012   ALKPHOS 52 01/19/2012   BILITOT 0.4 01/19/2012      IMPRESSION: Clinical stage IA (T1, N0, M0) invasive ductal carcinoma of the right breast. In terms of her local management, we discussed mastectomy versus partial mastectomy with or without radiation therapy, with without adjuvant hormone therapy. Based on the CALGB over age 63 trial, she is certainly a candidate for adjuvant hormone therapy alone, sparing her the potential toxicities, both acute and late, of radiation therapy. Because of her breast size and body habitus she would certainly have more skin toxicity. We discussed the slight increase in local failure with adjuvant hormone therapy alone compared to combination hormone therapy and radiation therapy, but there is no survival benefit for radiation therapy. All things considered, feel that she can be adequately treated with adjuvant hormone therapy alone. If she is found to have a positive sentinel lymph node then we can rethink her need for radiation therapy. We briefly discussed the potential acute and late toxicities of radiation therapy. She would very much like to avoid radiation therapy if possible.   PLAN: She will ahead with a partial mastectomy and sentinel lymph node biopsy. I can see her for a followup visit 2 weeks postoperatively.   I spent 40 minutes minutes face to face with the patient and more than 50% of that  time was spent in counseling and/or coordination of care.

## 2012-01-19 NOTE — Progress Notes (Signed)
Patient ID: [name], female   DOB: November 07, 1936, 76 y.o.   MRN: [id number]  Chief Complaint  Patient presents with  . Breast Cancer    right    HPI [name] is a 76 y.o. female.  She recently had a mammogram and an abnormality was found in the right breast upper-outer quadrant. A biopsy has shown invasive ductal carcinoma, low-grade, receptor positive, HER-2/neu negative, Ki-67 of 15% (low). She's been asymptomatic. She notices she has had some prior Abnormalities in the right breast seen on mammogram but this seems to be a new area. HPI  Past Medical History  Diagnosis Date  . Hypertension   . Diabetes mellitus   . Fibromyalgia     Past Surgical History  Procedure Date  . Abdominal hysterectomy   . Knee replacements     both right and left knees  . Rotator cuff sugery     bilateral  . Breast cyst removal     benign  76 years old  . Appendectomy   . Cholecystectomy circa 1978    Open - done in [place]    No family history on file.  Social History History  Substance Use Topics  . Smoking status: Former Smoker    Quit date: 11/15/2005  . Smokeless tobacco: Not on file  . Alcohol Use: No    No Known Allergies  Current Outpatient Prescriptions  Medication Sig Dispense Refill  . aspirin 81 MG tablet Take 81 mg by mouth daily.      . calcium carbonate (OS-CAL) 600 MG TABS Take 600 mg by mouth 2 (two) times daily with a meal.      . doxycycline (VIBRAMYCIN) 100 MG capsule Take 100 mg by mouth 2 (two) times daily.      . furosemide (LASIX) 20 MG tablet Take 20 mg by mouth 2 (two) times daily.      Marland Kitchen HYDROcodone-acetaminophen (VICODIN) 5-500 MG per tablet Take 1 tablet by mouth every 6 (six) hours as needed.      Marland Kitchen lisinopril (PRINIVIL,ZESTRIL) 20 MG tablet Take 60 mg by mouth daily.      . meloxicam (MOBIC) 7.5 MG tablet Take 7.5 mg by mouth daily.      . metFORMIN (GLUCOPHAGE) 1000 MG tablet Take 1,000 mg by mouth 2 (two) times daily with a meal.      .  metoprolol tartrate (LOPRESSOR) 25 MG tablet Take 25 mg by mouth 2 (two) times daily.      Marland Kitchen rOPINIRole (REQUIP) 2 MG tablet Take 2 mg by mouth 3 (three) times daily.      . simvastatin (ZOCOR) 40 MG tablet Take 40 mg by mouth every evening.      . solifenacin (VESICARE) 5 MG tablet Take 10 mg by mouth daily.      . traZODone (DESYREL) 50 MG tablet Take 50 mg by mouth at bedtime.        Review of Systems Review of Systems  Constitutional: Negative for fever, chills and unexpected weight change.  HENT: Negative for hearing loss, congestion, sore throat, trouble swallowing and voice change.   Eyes: Negative for visual disturbance.  Respiratory: Positive for apnea. Negative for cough and wheezing.        Sleep apnea, has CPAP  Cardiovascular: Negative for chest pain, palpitations and leg swelling.       HBP  Gastrointestinal: Negative for nausea, vomiting, abdominal pain, diarrhea, constipation, blood in stool, abdominal distention and anal bleeding.  Genitourinary: Negative  for hematuria, vaginal bleeding and difficulty urinating.  Musculoskeletal: Negative for arthralgias.  Skin: Negative for rash and wound.  Neurological: Negative for seizures, syncope and headaches.  Hematological: Negative for adenopathy. Does not bruise/bleed easily.  Psychiatric/Behavioral: Negative for confusion.    There were no vitals taken for this visit.  Physical Exam  VS:  Wt Readings from Last 3 Encounters:  01/19/12 282 lb (127.914 kg)   Temp Readings from Last 3 Encounters:  01/19/12 97.6 F (36.4 C) Oral   BP Readings from Last 3 Encounters:  01/19/12 178/92   Pulse Readings from Last 3 Encounters:  01/19/12 74    Physical Exam  Vitals reviewed. Constitutional: She is oriented to person, place, and time. She appears well-developed and well-nourished. No distress.       Morbidly obese  HENT:  Head: Normocephalic and atraumatic.  Mouth/Throat: Oropharynx is clear and moist.  Eyes:  Conjunctivae and EOM are normal. Pupils are equal, round, and reactive to light. No scleral icterus.  Neck: Normal range of motion. Neck supple. No tracheal deviation present. No thyromegaly present.  Cardiovascular: Normal rate, regular rhythm, normal heart sounds and intact distal pulses.  Exam reveals no gallop and no friction rub.   No murmur heard. Pulmonary/Chest: Effort normal and breath sounds normal. No respiratory distress. She has no wheezes. She has no rales.         Ecchymosis in R UOQ  Abdominal: Soft. Bowel sounds are normal. She exhibits no distension and no mass. There is no tenderness. There is no rebound and no guarding.  Musculoskeletal: Normal range of motion. She exhibits no edema and no tenderness.  Neurological: She is alert and oriented to person, place, and time.  Skin: Skin is warm and dry. No rash noted. She is not diaphoretic. No erythema.  Psychiatric: She has a normal mood and affect. Her behavior is normal. Judgment and thought content normal.    Data Reviewed I reviewed the mammograms and the MRI films and reports and discussed with the radiologist. I have reviewed the pathology reports and slides discussed with the pathologist  Assessment    Clinical stage I right breast cancer upper outer quadrant receptor positive, HER-2 negative Type 2 diabetes Hypertension Morbid obesity Sleep apnea    Plan    I think she is a candidate for a guidewire localized lumpectomy. It appears that she may not need radiation therapy or chemotherapy but if she has Nodal involvement the radiation therapist would like to do radiation so we will need to do a sentinel node as well. I have explained the pathophysiology and staging of breast cancer with particular attention to her exact situation. We discussed the multidisciplinary approach to breast cancer which often includes both medical and radiation oncology consultations.Those are being done today.  We also discussed  surgical options for the treatment of breast cancer including lumpectomy and mastectomy with possible reconstructive surgery. In addition we talked about the evaluation and management of lymph nodes including a description of sentinel lymph node biopsy and axillary dissections. We reviewed potential complications and risks including bleeding, infection, numbness,  lymphedema, and the potential need for additional surgery.  She understands that for patients who are candidate for lumpectomy or mastectomy there is an equal survival rate with either technique, but a slightly higher local recurrence rate with lumpectomy. In addition she knows that a lumpectomy usually requires postoperative radiation as part of the management of the breast cancer.  We have discussed the likely postoperative course  and plans for followup.  I have given the patient some written information that reviewed all of these issues. I believe her questions are answered and that she has a good understanding of the issues. She would like to go ahead and schedule right NL lumpectomy and SLN. Because of her co-morbidities we will keep her overnight.  CcLindwood Qua, MD, MD         Currie Paris 01/19/2012, 10:27 AM

## 2012-01-19 NOTE — Patient Instructions (Signed)
We will arrange surgery - a lumpectomy and removal of the sentinel nodes on the right. We will keep you overnight after surgery. Someone from my office should call by tomorrow afternoon to schedule this. Call the office if you have any questions or problems. [phone]. My nurse is Lesly Rubenstein and she can help with anything you need.

## 2012-01-19 NOTE — Patient Instructions (Signed)
1. You will see me back in 1 month after your surgery. 

## 2012-01-24 ENCOUNTER — Encounter: Payer: Self-pay | Admitting: *Deleted

## 2012-01-24 ENCOUNTER — Telehealth: Payer: Self-pay | Admitting: *Deleted

## 2012-01-24 NOTE — Telephone Encounter (Signed)
Spoke with pt concerning [facility] from 01/19/12.  Confirmed surgery date and f/u appt with Dr. Welton Flakes.  Pt denies questions or concerns regarding dx or treatment care plan.  Encourage pt to call with needs.  Received verbal understanding.  Contact information given.

## 2012-01-24 NOTE — Telephone Encounter (Signed)
Left msg for pt to return call regarding BMDC from 01/19/12.

## 2012-01-25 NOTE — Progress Notes (Signed)
[name] [id number] October 25, 1936 76 y.o. 01/25/2012 3:25 PM  CC  Lindwood Qua, MD, MD [address] [place] [address] Dr. Chipper Herb Dr. Calton Dach  REASON FOR CONSULTATION:  76 year old female with stage I NA invasive ductal carcinoma of the right breast diagnosed February 2013. Patient is seen in the multidisciplinary clinic for discussion of treatment options.  STAGE:   Cancer of upper-outer quadrant of female breast   Primary site: Breast (Right)   Staging method: AJCC 7th Edition   Clinical: Stage IA (T1c, N0, cM0)   Summary: Stage IA (T1c, N0, cM0)  REFERRING PHYSICIAN: Dr. Cyndia Bent  HISTORY OF PRESENT ILLNESS:  [name] is a 76 y.o. female.  With medical history significant for type 2 diabetes hypertension fibromyalgia. Patient began having screening mammograms at a age 45. Most recently she presented For a screening mammogram and was found to have an abnormality in the right upper outer quadrant of the right breast. She went on to have a core needle biopsy performed that showed a low grade invasive ductal carcinoma that was estrogen receptor +100% progesterone receptor +100% proliferation marker 15% and HER-2/neu negative with a ratio of 1.00. She went on to have MRI of the breasts performed that showed no MRI specific evidence of malignancy in the left breast no adenopathy the right breast showed an irregular heterogeneously enhancing mass with irregular margins in the upper outer quadrant of the right breast measuring 1.5 x 0.9 x 1.2 cm. Patient is now seen in the multidisciplinary breast clinic for discussion of treatment options. She is without any complaints.   Past Medical History: Past Medical History  Diagnosis Date  . Hypertension   . Diabetes mellitus   . Fibromyalgia     Past Surgical History: Past Surgical History  Procedure Date  . Abdominal hysterectomy   . Knee replacements     both right and left knees  . Rotator  cuff sugery     bilateral  . Breast cyst removal     benign  76 years old  . Appendectomy   . Cholecystectomy circa 1978    Open - done in [place]    Family History: History reviewed. No pertinent family history.  Social History History  Substance Use Topics  . Smoking status: Former Smoker    Quit date: 11/15/2005  . Smokeless tobacco: Not on file  . Alcohol Use: No    Allergies: No Known Allergies  Current Medications: Current Outpatient Prescriptions  Medication Sig Dispense Refill  . aspirin 81 MG tablet Take 81 mg by mouth daily.      . calcium carbonate (OS-CAL) 600 MG TABS Take 600 mg by mouth 2 (two) times daily with a meal.      . furosemide (LASIX) 20 MG tablet Take 20 mg by mouth 2 (two) times daily.      Marland Kitchen HYDROcodone-acetaminophen (VICODIN) 5-500 MG per tablet Take 1 tablet by mouth every 6 (six) hours as needed.      Marland Kitchen lisinopril (PRINIVIL,ZESTRIL) 20 MG tablet Take 60 mg by mouth daily.      . meloxicam (MOBIC) 7.5 MG tablet Take 7.5 mg by mouth daily.      . metFORMIN (GLUCOPHAGE) 1000 MG tablet Take 1,000 mg by mouth 2 (two) times daily with a meal.      . metoprolol tartrate (LOPRESSOR) 25 MG tablet Take 25 mg by mouth 2 (two) times daily.      Marland Kitchen rOPINIRole (REQUIP) 2 MG  tablet Take 2 mg by mouth 3 (three) times daily.      . simvastatin (ZOCOR) 40 MG tablet Take 40 mg by mouth every evening.      . solifenacin (VESICARE) 5 MG tablet Take 10 mg by mouth daily.      . traZODone (DESYREL) 50 MG tablet Take 50 mg by mouth at bedtime.        OB/GYN History:Menarche age 69 menopause 1973 she has not been on hormone replacement therapy she has carried 3 children first live birth was at 71.  Fertility Discussion:Not applicable Prior History of Cancer:None  Health Maintenance:  Colonoscopy2011 Bone Density2000 well Last PAP smear2000  ECOG PERFORMANCE STATUS: 0 - Asymptomatic  Genetic Counseling/testing:Patient did not meet criteria for genetic testing  and counseling  REVIEW OF SYSTEMS:  Constitutional: positive for fatigue and malaise Ears, nose, mouth, throat, and face: negative Respiratory: negative Cardiovascular: positive for fatigue and feet swelling Gastrointestinal: negative Genitourinary:negative Integument/breast: positive for breast lump and breast tenderness Hematologic/lymphatic: negative Musculoskeletal:positive for arthralgias, myalgias and stiff joints Neurological: negative  PHYSICAL EXAMINATION: Blood pressure 178/92, pulse 74, temperature 97.6 F (36.4 C), temperature source Oral, height 5' 1.5" (1.562 m), weight 282 lb (127.914 kg).  ZOX:WRUEA, no distress, well nourished and well developed SKIN: no rashes or significant lesions HEAD: No masses, lesions, tenderness or abnormalities EYES: normal, PERRLA, EOMI, Conjunctiva are pink and non-injected, sclera clear EARS: External ears normal OROPHARYNX:no exudate, no erythema and lips, buccal mucosa, and tongue normal  NECK: supple, no adenopathy, thyroid normal size, non-tender, without nodularity LYMPH:  no palpable lymphadenopathy, no hepatosplenomegaly BREAST:Right breast shows area of ecchymosis at the biopsy site no palpable masses except for hematoma LUNGS: clear to auscultation and percussion HEART: regular rate & rhythm, no murmurs and no gallops ABDOMEN:abdomen soft, non-tender, normal bowel sounds and no masses or organomegaly BACK: Back symmetric, no curvature., No CVA tenderness EXTREMITIES:Slight edema in the lower extremities  NEURO: alert & oriented x 3 with fluent speech, no focal motor/sensory deficits, gait normal, reflexes normal and symmetric    STUDIES/RESULTS: Mr Breast Bilateral W Wo Contrast  01/14/2012  *RADIOLOGY REPORT*  Clinical Data: New diagnosis right-sided breast cancer.  BILATERAL BREAST MRI WITH AND WITHOUT CONTRAST  Technique: Multiplanar, multisequence MR images of both breasts were obtained prior to and following the  intravenous administration of 10ml of Multihance.  Three dimensional images were evaluated at the independent DynaCad workstation.  Comparison:  No prior MRI.  Previous mammogram most recently dated 12/24/2011  Findings: An irregular, heterogeneously enhancing mass with irregular margins is imaged in the upper outer quadrant of the right breast, middle third measuring 1.5 x 0.9 x 1.2 cm correspond to the recently biopsied malignancy.  A biopsy clip is seen in association with the mass.  No other suspicious mass or enhancement seen in either breast.  No axillary or internal mammary adenopathy is present.  Of note, the inferior portion of the internal mammary chain is not imaged.  IMPRESSION: Known malignancy, right breast as detailed above.  No MRI specific evidence of malignancy, left breast.  No adenopathy.  THREE-DIMENSIONAL MR IMAGE RENDERING ON INDEPENDENT WORKSTATION:  Three-dimensional MR images were rendered by post-processing of the original MR data on an independent workstation.  The three- dimensional MR images were interpreted, and findings were reported in the accompanying complete MRI report for this study.  BI-RADS CATEGORY 6:  Known biopsy-proven malignancy - appropriate action should be taken.  Original Report Authenticated By: Hiram Gash,  M.D.   [place] Core Biopsy  01/11/2012  *RADIOLOGY REPORT*  Clinical Data:  Mass noted at 10 o'clock in the right breast on recent studies from [facility] dated 12/24/2011 and 01/04/2012.  ULTRASOUND GUIDED VACUUM ASSISTED CORE BIOPSY OF THE RIGHT BREAST  The patient and I discussed the procedure of ultrasound-guided biopsy, including benefits and alternatives.  We discussed the high likelihood of a successful procedure. We discussed the risks of the procedure, including infection, bleeding, tissue injury, clip migration and inadequate sampling.  Informed written consent was given.  Using sterile technique, 2% lidocaine, ultrasound guidance and a 12  gauge vacuum assisted needle, biopsy was performed of the mass at 10 o'clock 12 cm from the right nipple.  At the conclusion of the procedure, a ribbon tissue marker clip was deployed into the biopsy cavity.  Follow-up 2-view mammogram was performed and dictated separately.  Histologic evaluation demonstrates invasive ductal carcinoma, grade II.  This is concordant with the imaging findings.  Results were discussed with the patient by telephone at her request.  She reports no complications from the procedure.  The patient has been scheduled for breast MRI on 01/14/2012.   The patient requested referral to [facility] for treatment.  She has been scheduled to be seen in the [facility] at the [facility] on 01/19/2012.  IMPRESSION: Ultrasound-guided biopsy of a mass at 10 o'clock 12 cm from the right nipple. Invasive ductal carcinoma is diagnosed.  The patient has been scheduled for breast MRI on 01/14/2012 and the [facility] on 01/19/2012.  No apparent complications.  Original Report Authenticated By: Daryl Eastern, M.D.   Mm Digital Diagnostic Unilat R  01/10/2012  *RADIOLOGY REPORT*  Clinical Data:  Ultrasound-guided core needle biopsy of an irregular mass at 10 o'clock 12 cm from the right nipple.  DIGITAL DIAGNOSTIC RIGHT MAMMOGRAM  Comparison:  None.  Findings:  Films are performed following ultrasound guided biopsy of an irregular mass at 10 o'clock 12 cm from the right nipple. The InRad ribbon clip is appropriately positioned within the mass.  IMPRESSION: Appropriate clip placement following ultrasound-guided core needle biopsy of a mass at 10 o'clock 12 cm from the right nipple.  Original Report Authenticated By: Daryl Eastern, M.D.     LABS:    Chemistry      Component Value Date/Time   NA 137 01/19/2012 0808   K 4.3 01/19/2012 0808   CL 100 01/19/2012 0808   CO2 31 01/19/2012 0808   BUN  34* 01/19/2012 0808   CREATININE 1.23* 01/19/2012 0808      Component Value Date/Time   CALCIUM 10.1 01/19/2012 0808   ALKPHOS 52 01/19/2012 0808   AST 15 01/19/2012 0808   ALT 15 01/19/2012 0808   BILITOT 0.4 01/19/2012 0808      Lab Results  Component Value Date   WBC 8.5 01/19/2012   HGB 13.2 01/19/2012   HCT 39.5 01/19/2012   MCV 80.6 01/19/2012   PLT 239 01/19/2012       PATHOLOGY: Physician cc: [facility] CC MRN #: [id number] Chart #: DOB: [birth year] Age: 69 Gender: F Client Name: [facility] Physician: Cain Saupe, MD cc: Lindwood Qua - [phone] REPORT OF SURGICAL PATHOLOGY ADDITIONAL INFORMATION: CHROMOGENIC IN-SITU HYBRIDIZATION Interpretation HER-2/NEU BY CISH - NO AMPLIFICATION OF HER-2 DETECTED. THE RATIO OF HER-2: CEP 17 SIGNALS WAS 1.00. Reference range: Ratio: HER2:CEP17 < 1.8 - gene amplification  not observed Ratio: HER2:CEP 17 1.8-2.2 - equivocal result Ratio: HER2:CEP17 > 2.2 - gene amplification observed H. CATHERINE LI MD Pathologist, Electronic Signature ( Signed 01/14/2012) PROGNOSTIC INDICATORS - ACIS Results IMMUNOHISTOCHEMICAL AND MORPHOMETRIC ANALYSIS BY THE AUTOMATED CELLULAR IMAGING SYSTEM (ACIS) Estrogen Receptor (Negative, <1%): 100%, STRONG STAINING INTENSITY Progesterone Receptor (Negative, <1%): 100%, STRONG STAINING INTENSITY Proliferation Marker Ki67 by M IB-1 (Low<20%): 15% All controls stained appropriately Pecola Leisure MD Pathologist, Electronic Signature 1 of 3 FINAL for Kayla, Crawford ([id number]) ADDITIONAL INFORMATION:(continued) ( Signed 01/14/2012) An E-cadherin stain demonstrates focal granular membranous staining around some tumor cells. This pattern, in conjunction with the histologic appearance, is more consistent with invasive lobular carcinoma, in my opinion. (JK:mw 01-13-12) This is NOT signed out FINAL DIAGNOSIS Diagnosis Breast, right, needle core biopsy, 10 o'clock, 12 cm from nipple -  INVASIVE DUCTAL CARCINOMA. - SEE COMMENT. Microscopic Comment Although definitive grading of breast carcinoma is best done on excision, the features of the tumor from the right 10 o'clock needle core biopsy are compatible with a grade II breast carcinoma. Breast prognostic markers will be performed and reported in an addendum. The findings are called to [facility] on 01/11/2012. Dr. Frederica Kuster has seen this case in consultation with agreement. (RAH:eps 01/11/12) Zandra Abts MD Pathologist, Electronic Signature (Case signed 01/11/2012) Specimen Gross and Clinical Information Specimen Comment Irregular mass 10 o'clock right breast, 12 cm from nipple. Time in formalin 10:25 a.m, CIT less than 1 minute Specimen(s) Obtained: Breast, right, needle core biopsy, 10 o'clock, 12 cm from nipple Specimen Clinical Information  ASSESSMENT    76 year old female with  #11.5 cm invasive low grade ductal carcinoma ER positive PR positive HER-2/neu negative of the right breast diagnosed March 2013. Patient is a good candidate for a lumpectomy.  #2 hypertension  #3 fibromyalgia  Clinical Trial Eligibility:None Multidisciplinary conference discussion Should case was discussed at the multidisciplinary breast conference today.    PLAN:    #1 patient will proceed with a lumpectomy. This was discussed with Dr. Jamey Ripa.  #2 post lumpectomy we will send her specimen for Oncotype DX to evaluate risk score for discussion of adjuvant therapy. I do suspect due to the favorable prognostic markers she will only require antiestrogen therapy but the recurrence score will give [place] more information.  #3 patient will be seen back in 4 weeks' time.       Discussion: Patient is being treated per NCCN breast cancer care guidelines appropriate for stage.I   Thank you so much for allowing me to participate in the care of [name]. I will continue to follow up the patient with you and assist in  her care.  All questions were answered. The patient knows to call the clinic with any problems, questions or concerns. We can certainly see the patient much sooner if necessary.  I spent 40 minutes counseling the patient face to face. The total time spent in the appointment was 60 minutes.  Drue Second, MD Medical/Oncology [facility] [phone] (beeper) [phone] (Office)  01/25/2012, 3:25 PM 01/25/2012, 3:25 PM

## 2012-02-02 ENCOUNTER — Encounter (HOSPITAL_COMMUNITY): Payer: Self-pay | Admitting: Pharmacy Technician

## 2012-02-03 NOTE — Pre-Procedure Instructions (Deleted)
20 [name]   02/03/2012   Your procedure is scheduled on:  February 15, 2012   Report to [facility] at 0800 AM.   Call this number if you have problems the morning of surgery: [phone]   Remember:   Do not eat food:After Midnight.  May have clear liquids: up to 4 Hours before arrival .0400  AM                                                                           Clear liquids include soda, tea, black coffee, apple or grape juice, broth.             Take these medicines the morning of surgery with A SIP OF WATER  Metoprolol and Vicodin   Do not wear jewelry, make-up or nail polish.  Do not wear lotions, powders, or perfumes.    Do not shave 48 hours prior to surgery.   Do not bring valuables to the hospital.  Contacts, dentures or bridgework may not be worn into surgery.  Leave suitcase in the car. After surgery it may be brought to your room.  For patients admitted to the hospital, checkout time is 11:00 AM the day of discharge.   Patients discharged the day of surgery will not be allowed to drive home.    Special Instructions: CHG Shower Use Special Wash: 1/2 bottle night before surgery and 1/2 bottle morning of surgery.   Please read over the following fact sheets that you were given: Pain Booklet, Coughing and Deep Breathing, MRSA Information and Surgical Site Infection Prevention

## 2012-02-04 ENCOUNTER — Other Ambulatory Visit: Payer: Self-pay

## 2012-02-04 ENCOUNTER — Ambulatory Visit (HOSPITAL_COMMUNITY)
Admission: RE | Admit: 2012-02-04 | Discharge: 2012-02-04 | Disposition: A | Discharge status: Home / Self Care | Payer: Medicare Other | Source: Ambulatory Visit | Attending: Anesthesiology | Admitting: Anesthesiology

## 2012-02-04 ENCOUNTER — Encounter (HOSPITAL_COMMUNITY)
Admission: RE | Admit: 2012-02-04 | Discharge: 2012-02-04 | Disposition: A | Discharge status: Home / Self Care | Payer: Medicare Other | Source: Ambulatory Visit | Attending: Surgery | Admitting: Surgery

## 2012-02-04 ENCOUNTER — Encounter (HOSPITAL_COMMUNITY): Payer: Self-pay

## 2012-02-04 DIAGNOSIS — Z01818 Encounter for other preprocedural examination: Secondary | ICD-10-CM | POA: Insufficient documentation

## 2012-02-04 HISTORY — DX: Unspecified osteoarthritis, unspecified site: M19.90

## 2012-02-04 HISTORY — DX: Acute upper respiratory infection, unspecified: J06.9

## 2012-02-04 HISTORY — DX: Sleep apnea, unspecified: G47.30

## 2012-02-04 HISTORY — DX: Nausea with vomiting, unspecified: R11.2

## 2012-02-04 HISTORY — DX: Other specified postprocedural states: Z98.890

## 2012-02-04 LAB — CBC
HCT: 39.5 % (ref 36.0–46.0)
Hemoglobin: 12.9 g/dL (ref 12.0–15.0)
MCH: 27 pg (ref 26.0–34.0)
MCHC: 32.7 g/dL (ref 30.0–36.0)
RBC: 4.77 MIL/uL (ref 3.87–5.11)

## 2012-02-04 LAB — BASIC METABOLIC PANEL
BUN: 20 mg/dL (ref 6–23)
CO2: 28 mEq/L (ref 19–32)
Calcium: 9.7 mg/dL (ref 8.4–10.5)
GFR calc non Af Amer: 49 mL/min — ABNORMAL LOW (ref >90)
Glucose, Bld: 102 mg/dL — ABNORMAL HIGH (ref 70–99)
Sodium: 140 mEq/L (ref 135–145)

## 2012-02-04 LAB — SURGICAL PCR SCREEN: Staphylococcus aureus: NEGATIVE

## 2012-02-04 MED ORDER — CHLORHEXIDINE GLUCONATE 4 % EX LIQD: 1.0000 "application " | Freq: Once | CUTANEOUS | Status: DC

## 2012-02-04 NOTE — Progress Notes (Signed)
Instructed to bring mask for CPAP machine for hospital stay.

## 2012-02-04 NOTE — Progress Notes (Signed)
Requested Anesthesia consult to [facility]. Requested Sleep study from [facility] in [place].

## 2012-02-04 NOTE — Consult Note (Signed)
Anesthesia:  Patient is a 76 year old female scheduled for a needle localized right breast lumpectomy and SN biopsy on 02/15/12.  Her history includes morbid obesity, OSA with CPAP and night time O2 use, DM2 (controlled with A1C on 5.6 per patient), HTN, fibromyalgia, former smoker (quit >10 years ago), several surgeries including knee replacements, TAH, tonsillectomy, cholecystectomy and appendectomy.  She denies any prior Anesthesia complications other than post operative N/V.  Her PCP is Tommas Olp, NP with Dr. Lindwood Qua in [place].  Labs acceptable.  CXR shows no active disease.  EKG shows NSR, inferior infarct (age undetermined), cannot rule out anterior infarct (age undetermined).  Her R wave is now low in V3, but otherwise I don't think her EKG is significantly changed since 12/12/02.  She denies CP and SOB at rest.  She does have chronic DOE with mild-moderate activity. She has to ambulate long distances with a rolling walker due to pain (not chest pain) and SOB.  She has chronic minimal LE edema.    She had an echo done at [facility] on 08/18/06 that showed normal LV EF, diastolic LV dysfunction, mitral annular calcification without evidence of MR, aortic sclerosis without evidence of AR or hemodynamically important aortic transvalvular gradient.  Exam shows her heart with a RRR, no murmur noted, lungs clear, trace pre-tibial edema, no carotid bruits noted.  She is morbidly obese with OSA.  She is compliant with her CPAP use and her DM has been controlled.  EKG appears stable, and she has been asymptomatic.  Plan to proceed if no significant change in her status.  Reviewed with Anesthesiologist Dr. Michelle Piper who agrees.

## 2012-02-04 NOTE — Pre-Procedure Instructions (Deleted)
20 [name]  02/04/2012   Your procedure is scheduled on:  February 15, 2012  At 1000 AM  Report to [facility] at  )0800 AM.  Call this number if you have problems the morning of surgery: [phone]   Remember:   Do not eat food:After Midnight.  May have clear liquids: up to 4 Hours before arrival.  Clear liquids include soda, tea, black coffee, apple or grape juice, broth.  Take these medicines the morning of surgery with A SIP OF WATER: Metoprolol and               Vicodin  Do not wear jewelry, make-up or nail polish.  Do not wear lotions, powders, or perfumes.   Do not shave 48 hours prior to surgery.  Do not bring valuables to the hospital.  Contacts, dentures or bridgework may not be worn into surgery.  Leave suitcase in the car. After surgery it may be brought to your room.  For patients admitted to the hospital, checkout time is 11:00 AM the day of discharge.   Patients discharged the day of surgery will not be allowed to drive home.    Special Instructions: CHG Shower Use Special Wash: 1/2 bottle night before surgery and 1/2 bottle morning of surgery.   Please read over the following fact sheets that you were given: Pain Booklet, Coughing and Deep Breathing, MRSA Information and Surgical Site Infection Prevention   

## 2012-02-04 NOTE — Pre-Procedure Instructions (Signed)
20 [name]  02/04/2012   Your procedure is scheduled on:  February 15, 2012  At 1000 AM  Report to [facility] at  )0800 AM.  Call this number if you have problems the morning of surgery: [phone]   Remember:   Do not eat food:After Midnight.  May have clear liquids: up to 4 Hours before arrival.  Clear liquids include soda, tea, black coffee, apple or grape juice, broth.  Take these medicines the morning of surgery with A SIP OF WATER: Metoprolol and               Vicodin  Do not wear jewelry, make-up or nail polish.  Do not wear lotions, powders, or perfumes.   Do not shave 48 hours prior to surgery.  Do not bring valuables to the hospital.  Contacts, dentures or bridgework may not be worn into surgery.  Leave suitcase in the car. After surgery it may be brought to your room.  For patients admitted to the hospital, checkout time is 11:00 AM the day of discharge.   Patients discharged the day of surgery will not be allowed to drive home.    Special Instructions: CHG Shower Use Special Wash: 1/2 bottle night before surgery and 1/2 bottle morning of surgery.   Please read over the following fact sheets that you were given: Pain Booklet, Coughing and Deep Breathing, MRSA Information and Surgical Site Infection Prevention

## 2012-02-07 ENCOUNTER — Encounter: Payer: Self-pay | Admitting: *Deleted

## 2012-02-07 NOTE — Progress Notes (Signed)
Mailed after appt letter to pt. 

## 2012-02-14 MED ORDER — CHLORHEXIDINE GLUCONATE 4 % EX LIQD: 1.0000 "application " | Freq: Once | CUTANEOUS | Status: DC

## 2012-02-14 MED ORDER — CEFAZOLIN SODIUM-DEXTROSE 2-3 GM-% IV SOLR
2.0000 g | INTRAVENOUS | Status: AC
  Administered 2012-02-15: 2 g via INTRAVENOUS
  Filled 2012-02-14: qty 50

## 2012-02-15 ENCOUNTER — Ambulatory Visit (HOSPITAL_COMMUNITY): Payer: Medicare Other | Admitting: Vascular Surgery

## 2012-02-15 ENCOUNTER — Ambulatory Visit
Admission: RE | Admit: 2012-02-15 | Discharge: 2012-02-15 | Disposition: A | Discharge status: Home / Self Care | Payer: Medicare Other | Source: Ambulatory Visit | Attending: Surgery | Admitting: Surgery

## 2012-02-15 ENCOUNTER — Encounter (HOSPITAL_COMMUNITY): Payer: Self-pay | Admitting: Vascular Surgery

## 2012-02-15 ENCOUNTER — Ambulatory Visit (HOSPITAL_COMMUNITY)
Admission: RE | Admit: 2012-02-15 | Discharge: 2012-02-16 | Disposition: A | Discharge status: Home / Self Care | Payer: Medicare Other | Source: Ambulatory Visit | Attending: Surgery | Admitting: Surgery

## 2012-02-15 ENCOUNTER — Ambulatory Visit (HOSPITAL_COMMUNITY)
Admission: RE | Admit: 2012-02-15 | Discharge: 2012-02-15 | Disposition: A | Discharge status: Home / Self Care | Payer: Medicare Other | Source: Ambulatory Visit | Attending: Surgery | Admitting: Surgery

## 2012-02-15 ENCOUNTER — Encounter (HOSPITAL_COMMUNITY)
Admission: RE | Disposition: A | Discharge status: Home / Self Care | Payer: Self-pay | Source: Ambulatory Visit | Attending: Surgery

## 2012-02-15 ENCOUNTER — Encounter (HOSPITAL_COMMUNITY): Payer: Self-pay | Admitting: General Practice

## 2012-02-15 DIAGNOSIS — C50419 Malignant neoplasm of upper-outer quadrant of unspecified female breast: Secondary | ICD-10-CM

## 2012-02-15 DIAGNOSIS — D0590 Unspecified type of carcinoma in situ of unspecified breast: Secondary | ICD-10-CM

## 2012-02-15 DIAGNOSIS — E119 Type 2 diabetes mellitus without complications: Secondary | ICD-10-CM | POA: Insufficient documentation

## 2012-02-15 DIAGNOSIS — IMO0001 Reserved for inherently not codable concepts without codable children: Secondary | ICD-10-CM | POA: Insufficient documentation

## 2012-02-15 HISTORY — DX: Low back pain: M54.5

## 2012-02-15 HISTORY — DX: Type 2 diabetes mellitus without complications: E11.9

## 2012-02-15 HISTORY — DX: Shortness of breath: R06.02

## 2012-02-15 HISTORY — PX: MASTECTOMY PARTIAL / LUMPECTOMY W/ AXILLARY LYMPHADENECTOMY: SUR852

## 2012-02-15 HISTORY — DX: Restless legs syndrome: G25.81

## 2012-02-15 HISTORY — DX: Pure hypercholesterolemia, unspecified: E78.00

## 2012-02-15 HISTORY — DX: Other chronic pain: G89.29

## 2012-02-15 HISTORY — DX: Personal history of other diseases of the digestive system: Z87.19

## 2012-02-15 HISTORY — DX: Low back pain, unspecified: M54.50

## 2012-02-15 LAB — GLUCOSE, CAPILLARY: Glucose-Capillary: 117 mg/dL — ABNORMAL HIGH (ref 70–99)

## 2012-02-15 SURGERY — BREAST LUMPECTOMY WITH NEEDLE LOCALIZATION AND AXILLARY SENTINEL LYMPH NODE BX
Anesthesia: General | Site: Breast | Laterality: Right | Wound class: Clean

## 2012-02-15 MED ORDER — MIDAZOLAM HCL 5 MG/5ML IJ SOLN
INTRAMUSCULAR | Status: DC | PRN
  Administered 2012-02-15 (×2): 1 mg via INTRAVENOUS

## 2012-02-15 MED ORDER — ROPINIROLE HCL 1 MG PO TABS
6.0000 mg | ORAL_TABLET | Freq: Every day | ORAL | Status: DC
  Administered 2012-02-15: 6 mg via ORAL
  Filled 2012-02-15 (×2): qty 6

## 2012-02-15 MED ORDER — METFORMIN HCL 500 MG PO TABS
1000.0000 mg | ORAL_TABLET | Freq: Two times a day (BID) | ORAL | Status: DC
  Administered 2012-02-15 – 2012-02-16 (×2): 1000 mg via ORAL
  Filled 2012-02-15 (×4): qty 2

## 2012-02-15 MED ORDER — ROPINIROLE HCL 1 MG PO TABS: 2.0000 mg | ORAL_TABLET | Freq: Three times a day (TID) | ORAL | Status: DC

## 2012-02-15 MED ORDER — SODIUM CHLORIDE 0.9 % IJ SOLN
INTRAMUSCULAR | Status: DC | PRN
  Administered 2012-02-15: 3 mL

## 2012-02-15 MED ORDER — METHYLENE BLUE 1 % INJ SOLN
INTRAMUSCULAR | Status: DC | PRN
  Administered 2012-02-15: 2 mL

## 2012-02-15 MED ORDER — CALCIUM CARBONATE 600 MG PO TABS: 600.0000 mg | ORAL_TABLET | Freq: Two times a day (BID) | ORAL | Status: DC

## 2012-02-15 MED ORDER — LIRAGLUTIDE 18 MG/3ML ~~LOC~~ SOLN: 1.6000 mg | Freq: Every day | SUBCUTANEOUS | Status: DC

## 2012-02-15 MED ORDER — DROPERIDOL 2.5 MG/ML IJ SOLN: 0.6250 mg | INTRAMUSCULAR | Status: DC | PRN

## 2012-02-15 MED ORDER — ROPINIROLE HCL 1 MG PO TABS
2.0000 mg | ORAL_TABLET | Freq: Three times a day (TID) | ORAL | Status: DC
  Administered 2012-02-15 – 2012-02-16 (×2): 2 mg via ORAL
  Filled 2012-02-15 (×5): qty 2

## 2012-02-15 MED ORDER — EPHEDRINE SULFATE 50 MG/ML IJ SOLN
INTRAMUSCULAR | Status: DC | PRN
  Administered 2012-02-15 (×2): 5 mg via INTRAVENOUS

## 2012-02-15 MED ORDER — TECHNETIUM TC 99M SULFUR COLLOID FILTERED
1.0000 | Freq: Once | INTRAVENOUS | Status: AC | PRN
  Administered 2012-02-15: 1 via INTRADERMAL

## 2012-02-15 MED ORDER — ONDANSETRON HCL 4 MG/2ML IJ SOLN
INTRAMUSCULAR | Status: DC | PRN
  Administered 2012-02-15: 4 mg via INTRAVENOUS

## 2012-02-15 MED ORDER — GLYCOPYRROLATE 0.2 MG/ML IJ SOLN
INTRAMUSCULAR | Status: DC | PRN
  Administered 2012-02-15: .6 mg via INTRAVENOUS

## 2012-02-15 MED ORDER — BUPIVACAINE HCL (PF) 0.25 % IJ SOLN
INTRAMUSCULAR | Status: DC | PRN
  Administered 2012-02-15: 30 mL

## 2012-02-15 MED ORDER — POTASSIUM CHLORIDE 2 MEQ/ML IV SOLN
INTRAVENOUS | Status: DC
  Administered 2012-02-15: 15:00:00 via INTRAVENOUS
  Filled 2012-02-15 (×3): qty 1000

## 2012-02-15 MED ORDER — 0.9 % SODIUM CHLORIDE (POUR BTL) OPTIME
TOPICAL | Status: DC | PRN
  Administered 2012-02-15: 1000 mL

## 2012-02-15 MED ORDER — FUROSEMIDE 20 MG PO TABS
20.0000 mg | ORAL_TABLET | Freq: Every day | ORAL | Status: DC
  Administered 2012-02-15 – 2012-02-16 (×2): 20 mg via ORAL
  Filled 2012-02-15 (×2): qty 1

## 2012-02-15 MED ORDER — ROCURONIUM BROMIDE 100 MG/10ML IV SOLN
INTRAVENOUS | Status: DC | PRN
  Administered 2012-02-15: 50 mg via INTRAVENOUS

## 2012-02-15 MED ORDER — ONDANSETRON HCL 4 MG PO TABS: 4.0000 mg | ORAL_TABLET | Freq: Four times a day (QID) | ORAL | Status: DC | PRN

## 2012-02-15 MED ORDER — SIMVASTATIN 40 MG PO TABS
40.0000 mg | ORAL_TABLET | Freq: Every evening | ORAL | Status: DC
  Administered 2012-02-15: 40 mg via ORAL
  Filled 2012-02-15 (×2): qty 1

## 2012-02-15 MED ORDER — CALCIUM CARBONATE 1250 (500 CA) MG PO TABS
1.0000 | ORAL_TABLET | Freq: Two times a day (BID) | ORAL | Status: DC
  Administered 2012-02-15 – 2012-02-16 (×2): 500 mg via ORAL
  Filled 2012-02-15 (×4): qty 1

## 2012-02-15 MED ORDER — LACTATED RINGERS IV SOLN
INTRAVENOUS | Status: DC | PRN
  Administered 2012-02-15: 10:00:00 via INTRAVENOUS

## 2012-02-15 MED ORDER — ENOXAPARIN SODIUM 40 MG/0.4ML ~~LOC~~ SOLN
40.0000 mg | SUBCUTANEOUS | Status: DC
Start: 2012-02-16 — End: 2012-02-16
  Administered 2012-02-16: 40 mg via SUBCUTANEOUS
  Filled 2012-02-15 (×3): qty 0.4

## 2012-02-15 MED ORDER — SCOPOLAMINE 1 MG/3DAYS TD PT72
MEDICATED_PATCH | TRANSDERMAL | Status: DC | PRN
  Administered 2012-02-15: 1 via TRANSDERMAL

## 2012-02-15 MED ORDER — NEOSTIGMINE METHYLSULFATE 1 MG/ML IJ SOLN
INTRAMUSCULAR | Status: DC | PRN
  Administered 2012-02-15: 4 mg via INTRAVENOUS

## 2012-02-15 MED ORDER — PROPOFOL 10 MG/ML IV EMUL
INTRAVENOUS | Status: DC | PRN
  Administered 2012-02-15: 200 mg via INTRAVENOUS

## 2012-02-15 MED ORDER — MELOXICAM 7.5 MG PO TABS
7.5000 mg | ORAL_TABLET | Freq: Every day | ORAL | Status: DC
  Administered 2012-02-15 – 2012-02-16 (×2): 7.5 mg via ORAL
  Filled 2012-02-15 (×2): qty 1

## 2012-02-15 MED ORDER — ASPIRIN EC 81 MG PO TBEC
81.0000 mg | DELAYED_RELEASE_TABLET | Freq: Every evening | ORAL | Status: DC
  Administered 2012-02-15: 81 mg via ORAL
  Filled 2012-02-15 (×2): qty 1

## 2012-02-15 MED ORDER — LISINOPRIL 20 MG PO TABS
60.0000 mg | ORAL_TABLET | Freq: Every day | ORAL | Status: DC
  Administered 2012-02-15 – 2012-02-16 (×2): 60 mg via ORAL
  Filled 2012-02-15 (×2): qty 1

## 2012-02-15 MED ORDER — DARIFENACIN HYDROBROMIDE ER 7.5 MG PO TB24
7.5000 mg | ORAL_TABLET | Freq: Every day | ORAL | Status: DC
  Administered 2012-02-15 – 2012-02-16 (×2): 7.5 mg via ORAL
  Filled 2012-02-15 (×2): qty 1

## 2012-02-15 MED ORDER — HYDROMORPHONE HCL PF 1 MG/ML IJ SOLN: 0.2500 mg | INTRAMUSCULAR | Status: DC | PRN

## 2012-02-15 MED ORDER — ONDANSETRON HCL 4 MG/2ML IJ SOLN: 4.0000 mg | Freq: Four times a day (QID) | INTRAMUSCULAR | Status: DC | PRN

## 2012-02-15 MED ORDER — DEXAMETHASONE SODIUM PHOSPHATE 4 MG/ML IJ SOLN
INTRAMUSCULAR | Status: DC | PRN
  Administered 2012-02-15: 4 mg via INTRAVENOUS

## 2012-02-15 MED ORDER — MORPHINE SULFATE 2 MG/ML IJ SOLN: 2.0000 mg | INTRAMUSCULAR | Status: DC | PRN

## 2012-02-15 MED ORDER — HYDROCODONE-ACETAMINOPHEN 5-325 MG PO TABS
1.0000 | ORAL_TABLET | ORAL | Status: DC | PRN
  Administered 2012-02-15 (×3): 2 via ORAL
  Filled 2012-02-15 (×2): qty 2

## 2012-02-15 MED ORDER — FENTANYL CITRATE 0.05 MG/ML IJ SOLN
INTRAMUSCULAR | Status: DC | PRN
  Administered 2012-02-15: 50 ug via INTRAVENOUS
  Administered 2012-02-15: 100 ug via INTRAVENOUS
  Administered 2012-02-15: 50 ug via INTRAVENOUS

## 2012-02-15 MED ORDER — METOPROLOL TARTRATE 25 MG PO TABS
25.0000 mg | ORAL_TABLET | Freq: Two times a day (BID) | ORAL | Status: DC
Start: 2012-02-15 — End: 2012-02-16
  Administered 2012-02-15 – 2012-02-16 (×2): 25 mg via ORAL
  Filled 2012-02-15 (×3): qty 1

## 2012-02-15 MED ORDER — TRAZODONE HCL 100 MG PO TABS
100.0000 mg | ORAL_TABLET | Freq: Every day | ORAL | Status: DC
  Administered 2012-02-15: 100 mg via ORAL
  Filled 2012-02-15 (×2): qty 1

## 2012-02-15 SURGICAL SUPPLY — 55 items
ADH SKN CLS APL DERMABOND .7 (GAUZE/BANDAGES/DRESSINGS) ×1
APPLIER CLIP 9.375 MED OPEN (MISCELLANEOUS) ×2
APPLIER CLIP 9.375 SM OPEN (CLIP) ×2
APR CLP MED 9.3 20 MLT OPN (MISCELLANEOUS) ×1
APR CLP SM 9.3 20 MLT OPN (CLIP) ×1
BINDER BREAST LRG (GAUZE/BANDAGES/DRESSINGS) IMPLANT
BINDER BREAST XLRG (GAUZE/BANDAGES/DRESSINGS) ×1 IMPLANT
BLADE SURG 10 STRL SS (BLADE) ×2 IMPLANT
BLADE SURG 15 STRL LF DISP TIS (BLADE) ×1 IMPLANT
BLADE SURG 15 STRL SS (BLADE) ×2
CHLORAPREP W/TINT 26ML (MISCELLANEOUS) ×2 IMPLANT
CLIP APPLIE 9.375 MED OPEN (MISCELLANEOUS) IMPLANT
CLIP APPLIE 9.375 SM OPEN (CLIP) IMPLANT
CLOTH BEACON ORANGE TIMEOUT ST (SAFETY) ×2 IMPLANT
CONT SPEC 4OZ CLIKSEAL STRL BL (MISCELLANEOUS) ×2 IMPLANT
COVER PROBE W GEL 5X96 (DRAPES) ×2 IMPLANT
COVER SURGICAL LIGHT HANDLE (MISCELLANEOUS) ×2 IMPLANT
DECANTER SPIKE VIAL GLASS SM (MISCELLANEOUS) ×1 IMPLANT
DERMABOND ADVANCED (GAUZE/BANDAGES/DRESSINGS) ×1
DERMABOND ADVANCED .7 DNX12 (GAUZE/BANDAGES/DRESSINGS) ×1 IMPLANT
DEVICE DUBIN SPECIMEN MAMMOGRA (MISCELLANEOUS) ×2 IMPLANT
DRAPE CHEST BREAST 15X10 FENES (DRAPES) ×2 IMPLANT
DRAPE UTILITY 15X26 W/TAPE STR (DRAPE) ×4 IMPLANT
ELECT CAUTERY BLADE 6.4 (BLADE) ×2 IMPLANT
ELECT REM PT RETURN 9FT ADLT (ELECTROSURGICAL) ×2
ELECTRODE REM PT RTRN 9FT ADLT (ELECTROSURGICAL) ×1 IMPLANT
GLOVE BIOGEL PI IND STRL 7.0 (GLOVE) IMPLANT
GLOVE BIOGEL PI INDICATOR 7.0 (GLOVE) ×1
GLOVE ECLIPSE 6.5 STRL STRAW (GLOVE) ×1 IMPLANT
GLOVE EUDERMIC 7 POWDERFREE (GLOVE) ×2 IMPLANT
GLOVE SURG SS PI 6.5 STRL IVOR (GLOVE) ×2 IMPLANT
GOWN PREVENTION PLUS XLARGE (GOWN DISPOSABLE) ×2 IMPLANT
GOWN STRL NON-REIN LRG LVL3 (GOWN DISPOSABLE) ×3 IMPLANT
KIT BASIN OR (CUSTOM PROCEDURE TRAY) ×2 IMPLANT
KIT MARKER MARGIN INK (KITS) ×1 IMPLANT
KIT ROOM TURNOVER OR (KITS) ×2 IMPLANT
NDL 18GX1X1/2 (RX/OR ONLY) (NEEDLE) ×1 IMPLANT
NDL HYPO 25GX1X1/2 BEV (NEEDLE) ×2 IMPLANT
NEEDLE 18GX1X1/2 (RX/OR ONLY) (NEEDLE) ×2 IMPLANT
NEEDLE HYPO 25GX1X1/2 BEV (NEEDLE) ×4 IMPLANT
NS IRRIG 1000ML POUR BTL (IV SOLUTION) ×2 IMPLANT
PACK SURGICAL SETUP 50X90 (CUSTOM PROCEDURE TRAY) ×2 IMPLANT
PAD ARMBOARD 7.5X6 YLW CONV (MISCELLANEOUS) ×2 IMPLANT
PENCIL BUTTON HOLSTER BLD 10FT (ELECTRODE) ×2 IMPLANT
SPONGE LAP 4X18 X RAY DECT (DISPOSABLE) ×2 IMPLANT
STAPLER VISISTAT 35W (STAPLE) ×2 IMPLANT
SUT MON AB 4-0 PC3 18 (SUTURE) ×2 IMPLANT
SUT SILK 3 0 (SUTURE)
SUT SILK 3-0 18XBRD TIE 12 (SUTURE) IMPLANT
SUT VIC AB 3-0 SH 18 (SUTURE) ×2 IMPLANT
SYR CONTROL 10ML LL (SYRINGE) ×4 IMPLANT
TOWEL OR 17X24 6PK STRL BLUE (TOWEL DISPOSABLE) ×2 IMPLANT
TOWEL OR 17X26 10 PK STRL BLUE (TOWEL DISPOSABLE) ×2 IMPLANT
TUBE CONNECTING 12X1/4 (SUCTIONS) ×2 IMPLANT
YANKAUER SUCT BULB TIP NO VENT (SUCTIONS) ×2 IMPLANT

## 2012-02-15 NOTE — Transfer of Care (Signed)
Immediate Anesthesia Transfer of Care Note  Patient: [name]  Procedure(s) Performed: Procedure(s) (LRB): BREAST LUMPECTOMY WITH NEEDLE LOCALIZATION AND AXILLARY SENTINEL LYMPH NODE BX (Right)  Patient Location: PACU  Anesthesia Type: General  Level of Consciousness: awake, alert  and oriented  Airway & Oxygen Therapy: Patient Spontanous Breathing and Patient connected to face mask oxygen  Post-op Assessment: Report given to PACU RN, Post -op Vital signs reviewed and stable and Patient moving all extremities  Post vital signs: Reviewed and stable  Complications: No apparent anesthesia complications

## 2012-02-15 NOTE — Op Note (Signed)
[name]  21-Nov-1935  [id number]  02/15/2012   Preoperative diagnosis: Right breast cancer, upper outer quadrant, clinical stage I.  Postoperative diagnosis: Same  Procedure: Needle localized right partial mastectomy with blue dye injection and axillary sentinel lymph node excision.  Surgeon: Currie Paris, MD, FACS  Anesthesia: General  Clinical History and Indications: this patient presents for a guidewire localized excision of a Right breast Cancer.We also planned a right axillary sentinel lymph node excision.  Description of procedure: The patient was seen in the holding area and the plans for the procedure reviewed. The Right breast was marked as the operative side. The wire localizing films were reviewed.  The patient was taken to the operating room and after satisfactory general anesthesia had been obtained the Right breast was prepped and draped and the timeout was performed.5 cc of dilute methylene blue dye was injected and massaged in.  The incision was made over the presumed area of the mass. Skin flaps were raised and using cautery the area was completely excised. Bleeders were controlled with either cautery or sutures as needed.I was able to palpate the mass within the lumpectomy specimen and I thought it was completely around it. Nevertheless, to be sure that we had negative margins I took additional margin from superior, medial, inferior, lateral, and deep margins. Specimen mammogram showed the clip in the mass in the specimen. The specimen was inked for margin assessment.  I made sure everything was dry and put 20 cc of 0.25% plain Marcaine in to help with postop pain relief. I irrigated again and again checked for hemostasis and things appeared dry. Bleeders were controlled with cautery or sutures as I did the lumpectomy. The incision margins were marked with clips and the incision closed in layers with 3-0 Vicryl, 4-0 Monocryl subcuticular, and Dermabond.  The  neoprobe identified a hot area in the axilla and made a transverse incision and divided the tissues with cautery. A fairly large vessel was clipped. I was able to identify a hot area and palpate a lymph node and excised it and it had counts of about 280. Once that was removed there was no obvious palpable abnormality, counts were basically 0, and I never saw any blue dye entering the axilla.  I irrigated in nature everything was dry. I put 10 cc of 0.25% plain Marcaine and for postop pain relief.Another irrigation checked for hemostasis was made and everything was dry so the incision was closed in layers with 3-0 Vicryl, 4-0 Monocryl subcuticular and Dermabond.  After achieving hemostasis, the incision was closed with 3-0 Vicryl, 4-0 Monocryl subcuticular, and Dermabond. The patient tolerated the procedure well. There were no operative complications. All counts were correct.   EBL: Minimal  Currie Paris, MD, FACS 02/15/2012 11:37 AM

## 2012-02-15 NOTE — Anesthesia Procedure Notes (Signed)
Procedure Name: Intubation Date/Time: 02/15/2012 10:19 AM Performed by: Luster Landsberg Pre-anesthesia Checklist: Patient identified, Emergency Drugs available, Suction available and Patient being monitored Patient Re-evaluated:Patient Re-evaluated prior to inductionOxygen Delivery Method: Circle system utilized Preoxygenation: Pre-oxygenation with 100% oxygen Intubation Type: IV induction Ventilation: Mask ventilation without difficulty Laryngoscope Size: Mac and 3 Grade View: Grade I Tube type: Oral Tube size: 7.5 mm Number of attempts: 1 Airway Equipment and Method: Stylet Placement Confirmation: ETT inserted through vocal cords under direct vision,  positive ETCO2 and breath sounds checked- equal and bilateral Secured at: 20 cm Tube secured with: Tape Dental Injury: Teeth and Oropharynx as per pre-operative assessment

## 2012-02-15 NOTE — Progress Notes (Signed)
Report to Dr. Krista Blue, pt. Consumed <1/2 cup of black coffee at 0550 today.

## 2012-02-15 NOTE — Anesthesia Preprocedure Evaluation (Signed)
Anesthesia Evaluation  Patient identified by MRN, date of birth, ID band  Reviewed: Allergy & Precautions, H&P , NPO status , Patient's Chart, lab work & pertinent test results, reviewed documented beta blocker date and time   History of Anesthesia Complications (+) PONV  Airway Mallampati: II TM Distance: >3 FB Neck ROM: Full    Dental  (+) Teeth Intact and Dental Advisory Given   Pulmonary shortness of breath and with exertion, sleep apnea and Continuous Positive Airway Pressure Ventilation ,  breath sounds clear to auscultation  Pulmonary exam normal       Cardiovascular hypertension, Pt. on medications Rhythm:Regular Rate:Normal     Neuro/Psych    GI/Hepatic negative GI ROS, Neg liver ROS,   Endo/Other  Diabetes mellitus-, Type 2, Oral Hypoglycemic Agents  Renal/GU negative Renal ROS     Musculoskeletal  (+) Fibromyalgia -  Abdominal   Peds  Hematology   Anesthesia Other Findings   Reproductive/Obstetrics                           Anesthesia Physical Anesthesia Plan  ASA: III  Anesthesia Plan: General   Post-op Pain Management:    Induction: Intravenous  Airway Management Planned: Oral ETT and LMA  Additional Equipment:   Intra-op Plan:   Post-operative Plan: Extubation in OR  Informed Consent: I have reviewed the patients History and Physical, chart, labs and discussed the procedure including the risks, benefits and alternatives for the proposed anesthesia with the patient or authorized representative who has indicated his/her understanding and acceptance.   Dental advisory given  Plan Discussed with: CRNA, Anesthesiologist and Surgeon  Anesthesia Plan Comments:         Anesthesia Quick Evaluation

## 2012-02-15 NOTE — Anesthesia Postprocedure Evaluation (Signed)
Anesthesia Post Note  Patient: [name]  Procedure(s) Performed: Procedure(s) (LRB): BREAST LUMPECTOMY WITH NEEDLE LOCALIZATION AND AXILLARY SENTINEL LYMPH NODE BX (Right)  Anesthesia type: general  Patient location: PACU  Post pain: Pain level controlled  Post assessment: Patient's Cardiovascular Status Stable  Last Vitals:  Filed Vitals:   02/15/12 1153  BP: 141/50  Pulse: 80  Temp: 37.2 C  Resp: 20    Post vital signs: Reviewed and stable  Level of consciousness: sedated  Complications: No apparent anesthesia complications

## 2012-02-15 NOTE — Interval H&P Note (Signed)
History and Physical Interval Note:  02/15/2012 9:58 AM  [name]  has presented today for surgery, with the diagnosis of Right breast cancer  The various methods of treatment have been discussed with the patient and family. After consideration of risks, benefits and other options for treatment, the patient has consented to  Procedure(s) (LRB): BREAST LUMPECTOMY WITH NEEDLE LOCALIZATION AND AXILLARY SENTINEL LYMPH NODE BX (Right) as a surgical intervention .  The patients' history has been reviewed, patient examined, no change in status, stable for surgery.  I have reviewed the patients' chart and labs.  Questions were answered to the patient's satisfaction.  I have reviewed the wire plaecment films and initialed the right breast as the operative side.   Peggi Yono J

## 2012-02-15 NOTE — H&P (View-Only) (Signed)
Patient ID: [name], female   DOB: 12-11-1935, 76 y.o.   MRN: [id number]  Chief Complaint  Patient presents with  . Breast Cancer    right    HPI [name] is a 76 y.o. female.  She recently had a mammogram and an abnormality was found in the right breast upper-outer quadrant. A biopsy has shown invasive ductal carcinoma, low-grade, receptor positive, HER-2/neu negative, Ki-67 of 15% (low). She's been asymptomatic. She notices she has had some prior Abnormalities in the right breast seen on mammogram but this seems to be a new area. HPI  Past Medical History  Diagnosis Date  . Hypertension   . Diabetes mellitus   . Fibromyalgia     Past Surgical History  Procedure Date  . Abdominal hysterectomy   . Knee replacements     both right and left knees  . Rotator cuff sugery     bilateral  . Breast cyst removal     benign  76 years old  . Appendectomy   . Cholecystectomy circa 1978    Open - done in [place]    No family history on file.  Social History History  Substance Use Topics  . Smoking status: Former Smoker    Quit date: 11/15/2005  . Smokeless tobacco: Not on file  . Alcohol Use: No    No Known Allergies  Current Outpatient Prescriptions  Medication Sig Dispense Refill  . aspirin 81 MG tablet Take 81 mg by mouth daily.      . calcium carbonate (OS-CAL) 600 MG TABS Take 600 mg by mouth 2 (two) times daily with a meal.      . doxycycline (VIBRAMYCIN) 100 MG capsule Take 100 mg by mouth 2 (two) times daily.      . furosemide (LASIX) 20 MG tablet Take 20 mg by mouth 2 (two) times daily.      Marland Kitchen HYDROcodone-acetaminophen (VICODIN) 5-500 MG per tablet Take 1 tablet by mouth every 6 (six) hours as needed.      Marland Kitchen lisinopril (PRINIVIL,ZESTRIL) 20 MG tablet Take 60 mg by mouth daily.      . meloxicam (MOBIC) 7.5 MG tablet Take 7.5 mg by mouth daily.      . metFORMIN (GLUCOPHAGE) 1000 MG tablet Take 1,000 mg by mouth 2 (two) times daily with a meal.      .  metoprolol tartrate (LOPRESSOR) 25 MG tablet Take 25 mg by mouth 2 (two) times daily.      Marland Kitchen rOPINIRole (REQUIP) 2 MG tablet Take 2 mg by mouth 3 (three) times daily.      . simvastatin (ZOCOR) 40 MG tablet Take 40 mg by mouth every evening.      . solifenacin (VESICARE) 5 MG tablet Take 10 mg by mouth daily.      . traZODone (DESYREL) 50 MG tablet Take 50 mg by mouth at bedtime.        Review of Systems Review of Systems  Constitutional: Negative for fever, chills and unexpected weight change.  HENT: Negative for hearing loss, congestion, sore throat, trouble swallowing and voice change.   Eyes: Negative for visual disturbance.  Respiratory: Positive for apnea. Negative for cough and wheezing.        Sleep apnea, has CPAP  Cardiovascular: Negative for chest pain, palpitations and leg swelling.       HBP  Gastrointestinal: Negative for nausea, vomiting, abdominal pain, diarrhea, constipation, blood in stool, abdominal distention and anal bleeding.  Genitourinary: Negative  for hematuria, vaginal bleeding and difficulty urinating.  Musculoskeletal: Negative for arthralgias.  Skin: Negative for rash and wound.  Neurological: Negative for seizures, syncope and headaches.  Hematological: Negative for adenopathy. Does not bruise/bleed easily.  Psychiatric/Behavioral: Negative for confusion.    There were no vitals taken for this visit.  Physical Exam  VS:  Wt Readings from Last 3 Encounters:  01/19/12 282 lb (127.914 kg)   Temp Readings from Last 3 Encounters:  01/19/12 97.6 F (36.4 C) Oral   BP Readings from Last 3 Encounters:  01/19/12 178/92   Pulse Readings from Last 3 Encounters:  01/19/12 74    Physical Exam  Vitals reviewed. Constitutional: She is oriented to person, place, and time. She appears well-developed and well-nourished. No distress.       Morbidly obese  HENT:  Head: Normocephalic and atraumatic.  Mouth/Throat: Oropharynx is clear and moist.  Eyes:  Conjunctivae and EOM are normal. Pupils are equal, round, and reactive to light. No scleral icterus.  Neck: Normal range of motion. Neck supple. No tracheal deviation present. No thyromegaly present.  Cardiovascular: Normal rate, regular rhythm, normal heart sounds and intact distal pulses.  Exam reveals no gallop and no friction rub.   No murmur heard. Pulmonary/Chest: Effort normal and breath sounds normal. No respiratory distress. She has no wheezes. She has no rales.         Ecchymosis in R UOQ  Abdominal: Soft. Bowel sounds are normal. She exhibits no distension and no mass. There is no tenderness. There is no rebound and no guarding.  Musculoskeletal: Normal range of motion. She exhibits no edema and no tenderness.  Neurological: She is alert and oriented to person, place, and time.  Skin: Skin is warm and dry. No rash noted. She is not diaphoretic. No erythema.  Psychiatric: She has a normal mood and affect. Her behavior is normal. Judgment and thought content normal.    Data Reviewed I reviewed the mammograms and the MRI films and reports and discussed with the radiologist. I have reviewed the pathology reports and slides discussed with the pathologist  Assessment    Clinical stage I right breast cancer upper outer quadrant receptor positive, HER-2 negative Type 2 diabetes Hypertension Morbid obesity Sleep apnea    Plan    I think she is a candidate for a guidewire localized lumpectomy. It appears that she may not need radiation therapy or chemotherapy but if she has Nodal involvement the radiation therapist would like to do radiation so we will need to do a sentinel node as well. I have explained the pathophysiology and staging of breast cancer with particular attention to her exact situation. We discussed the multidisciplinary approach to breast cancer which often includes both medical and radiation oncology consultations.Those are being done today.  We also discussed  surgical options for the treatment of breast cancer including lumpectomy and mastectomy with possible reconstructive surgery. In addition we talked about the evaluation and management of lymph nodes including a description of sentinel lymph node biopsy and axillary dissections. We reviewed potential complications and risks including bleeding, infection, numbness,  lymphedema, and the potential need for additional surgery.  She understands that for patients who are candidate for lumpectomy or mastectomy there is an equal survival rate with either technique, but a slightly higher local recurrence rate with lumpectomy. In addition she knows that a lumpectomy usually requires postoperative radiation as part of the management of the breast cancer.  We have discussed the likely postoperative course  and plans for followup.  I have given the patient some written information that reviewed all of these issues. I believe her questions are answered and that she has a good understanding of the issues. She would like to go ahead and schedule right NL lumpectomy and SLN. Because of her co-morbidities we will keep her overnight.  CcLindwood Qua, MD, MD         Currie Paris 01/19/2012, 10:27 AM

## 2012-02-16 ENCOUNTER — Telehealth (INDEPENDENT_AMBULATORY_CARE_PROVIDER_SITE_OTHER): Payer: Self-pay | Admitting: General Surgery

## 2012-02-16 MED ORDER — HYDROCODONE-ACETAMINOPHEN 5-325 MG PO TABS: 1.0000 | ORAL_TABLET | ORAL | Status: AC | PRN

## 2012-02-16 NOTE — Progress Notes (Signed)
Patient discharged to home with discharge instructions, verbalized understanding, accompanied by daughter

## 2012-02-16 NOTE — Progress Notes (Signed)
2340 Patient's IV infiltrated in the left hand. Patient refuses to have IV restarted. Patient states " that she is drinking a lot of fluids and doesn't need the IV".

## 2012-02-16 NOTE — Telephone Encounter (Signed)
Patient aware path results are good. Lymph nodes negative and margins ok. She will follow up in the office at her scheduled appt and call with any questions prior.  

## 2012-02-16 NOTE — Discharge Summary (Signed)
  Patient ID: [name] [id number] 76 y.o. 1936/02/06  02/15/2012  Discharge date and time: 02/16/2012 10:12 AM  Admitting Physician: Currie Paris  Discharge Physician: Currie Paris  Admission Diagnoses: Right breast cancer, upper outer quadrant  Discharge Diagnoses: Same, Stage I  Operations: Procedure(s): BREAST LUMPECTOMY WITH NEEDLE LOCALIZATION AND AXILLARY SENTINEL LYMPH NODE BX  Admission Condition: good  Discharged Condition: good  Indication for Admission: Surgery and post op care  Hospital Course: Had an uneventful evening and night after surgery and ready to go home the next day  Consults: None  Significant Diagnostic Studies: Pahtology - IDC, Negative SLN (1), and negative margins  Treatments: surgery: Lumpectomy and sentinel node  Disposition: Home  Patient Instructions:   [place], [place]  Home Medication Instructions WUJ:[id number]   Printed on:02/16/12 1658  Medication Information                    furosemide (LASIX) 20 MG tablet Take 20 mg by mouth daily.            lisinopril (PRINIVIL,ZESTRIL) 20 MG tablet Take 60 mg by mouth daily.           rOPINIRole (REQUIP) 2 MG tablet Take 2-6 mg by mouth 4 (four) times daily -  before meals and at bedtime. 1 tablet with meals and 3 tablets at bedtime           metFORMIN (GLUCOPHAGE) 1000 MG tablet Take 1,000 mg by mouth 2 (two) times daily with a meal.           calcium carbonate (OS-CAL) 600 MG TABS Take 600 mg by mouth 2 (two) times daily with a meal.           solifenacin (VESICARE) 5 MG tablet Take 5 mg by mouth daily.            metoprolol tartrate (LOPRESSOR) 25 MG tablet Take 25 mg by mouth 2 (two) times daily.           simvastatin (ZOCOR) 40 MG tablet Take 40 mg by mouth every evening.           traZODone (DESYREL) 50 MG tablet Take 100 mg by mouth at bedtime.            HYDROcodone-acetaminophen (VICODIN) 5-500 MG per tablet Take 1 tablet by mouth every 6 (six) hours  as needed. For pain           meloxicam (MOBIC) 7.5 MG tablet Take 7.5 mg by mouth daily.           nystatin (MYCOSTATIN) powder Apply 1 g topically 3 (three) times daily as needed. For fungus           aspirin EC 81 MG tablet Take 81 mg by mouth every evening.           Liraglutide (VICTOZA) 18 MG/3ML SOLN Inject 1.6 mg into the skin daily.           HYDROcodone-acetaminophen (NORCO) 5-325 MG per tablet Take 1 tablet by mouth every 4 (four) hours as needed for pain.             Activity: activity as tolerated  Diet: diabetic diet  Wound Care: keep wound clean and dry and as directed  Follow-up:  With Dr Jamey Ripa in 2 week.

## 2012-02-16 NOTE — Telephone Encounter (Signed)
Message copied by Liliana Cline on Wed Feb 16, 2012  5:09 PM ------      Message from: Currie Paris      Created: Wed Feb 16, 2012  4:57 PM       Tell the patient that her margins are OK and her lymph nodes are negative. I will discuss in detail in the office.

## 2012-02-16 NOTE — Progress Notes (Signed)
1 Day Post-Op  Subjective: Feels good, slept well, minimal pain, wants to go home  Objective: Vital signs in last 24 hours: Temp:  [97.8 F (36.6 C)-98.9 F (37.2 C)] 97.8 F (36.6 C) (04/03 0607) Pulse Rate:  [62-80] 70  (04/03 0607) Resp:  [18-20] 18  (04/03 0607) BP: (116-170)/(50-83) 129/68 mmHg (04/03 0607) SpO2:  [94 %-98 %] 94 % (04/03 0607) Weight:  [282 lb (127.914 kg)] 282 lb (127.914 kg) (04/02 1700)   Intake/Output from previous day: 04/02 0701 - 04/03 0700 In: 1140 [P.O.:240; I.V.:900] Out: 2200 [Urine:2150; Blood:50] Intake/Output this shift:     General appearance: alert and no distress Resp: clear to auscultation bilaterally  Incision: Incisions clean and dry, no evidence hematoma or problem  Lab Results:  No results found for this basename: WBC:2,HGB:2,HCT:2,PLT:2 in the last 72 hours BMET No results found for this basename: NA:2,K:2,CL:2,CO2:2,GLUCOSE:2,BUN:2,CREATININE:2,CALCIUM:2 in the last 72 hours PT/INR No results found for this basename: LABPROT:2,INR:2 in the last 72 hours ABG No results found for this basename: PHART:2,PCO2:2,PO2:2,HCO3:2 in the last 72 hours  MEDS, Scheduled    . aspirin EC  81 mg Oral QPM  . calcium carbonate  1 tablet Oral BID WC  .  ceFAZolin (ANCEF) IV  2 g Intravenous 60 min Pre-Op  . darifenacin  7.5 mg Oral Daily  . enoxaparin  40 mg Subcutaneous Q24H  . furosemide  20 mg Oral Daily  . Liraglutide  1.62 mg Subcutaneous Daily  . lisinopril  60 mg Oral Daily  . meloxicam  7.5 mg Oral Daily  . metFORMIN  1,000 mg Oral BID WC  . metoprolol tartrate  25 mg Oral BID  . rOPINIRole  2 mg Oral TID WC  . rOPINIRole  6 mg Oral QHS  . simvastatin  40 mg Oral QPM  . traZODone  100 mg Oral QHS  . DISCONTD: calcium carbonate  600 mg Oral BID WC  . DISCONTD: chlorhexidine  1 application Topical Once  . DISCONTD: rOPINIRole  2-6 mg Oral TID AC & HS    Studies/Results: Mm Breast Surgical Specimen  02/15/2012  *RADIOLOGY  REPORT*  Clinical Data:  Right breast carcinoma  RIGHT BREAST NEEDLE LOCALIZATION WITH MAMMOGRAPHIC GUIDANCE AND SPECIMEN RADIOGRAPH  Patient presents for needle localization prior to surgical excision.  The patient and I discussed the procedure of needle localization including benefits and alternatives.  We discussed the high likelihood of a successful procedure.  We discussed the risks of the procedure, including infection, bleeding, tissue injury and further surgery.  Informed written consent was given.  Using mammographic guidance, sterile technique, 2% lidocaine and a 5 cm modified Kopans needle, the mass in the right upper outer quadrant posteriorly was localized using a lateromedial approach. Films were labeled and sent with the patient to surgery.  She tolerated the procedure well.  Specimen radiograph was performed at [facility] operating room and confirms the mass, clip and wire to be present in the tissue sample.  The specimen is marked for pathology.  IMPRESSION: Needle localization right breast.  No apparent complications.  Original Report Authenticated By: Daryl Eastern, M.D.   Mm Breast Wire Localization Right  02/15/2012  *RADIOLOGY REPORT*  Clinical Data:  Right breast carcinoma  RIGHT BREAST NEEDLE LOCALIZATION WITH MAMMOGRAPHIC GUIDANCE AND SPECIMEN RADIOGRAPH  Patient presents for needle localization prior to surgical excision.  The patient and I discussed the procedure of needle localization including benefits and alternatives.  We discussed the high likelihood of a  successful procedure.  We discussed the risks of the procedure, including infection, bleeding, tissue injury and further surgery.  Informed written consent was given.  Using mammographic guidance, sterile technique, 2% lidocaine and a 5 cm modified Kopans needle, the mass in the right upper outer quadrant posteriorly was localized using a lateromedial approach. Films were labeled and sent with the patient to surgery.   She tolerated the procedure well.  Specimen radiograph was performed at [facility] operating room and confirms the mass, clip and wire to be present in the tissue sample.  The specimen is marked for pathology.  IMPRESSION: Needle localization right breast.  No apparent complications.  Original Report Authenticated By: Daryl Eastern, M.D.    Assessment: s/p Procedure(s): BREAST LUMPECTOMY WITH NEEDLE LOCALIZATION AND AXILLARY SENTINEL LYMPH NODE BX Doing well and able to go home  Plan: Discharge   LOS: 1 day     Currie Paris, MD, [facility], [place] [phone]   02/16/2012 7:30 AM

## 2012-02-18 ENCOUNTER — Encounter: Payer: Self-pay | Admitting: *Deleted

## 2012-02-18 NOTE — Progress Notes (Signed)
Sent path off for Oncotype Dx testing.

## 2012-03-01 ENCOUNTER — Ambulatory Visit (INDEPENDENT_AMBULATORY_CARE_PROVIDER_SITE_OTHER): Payer: Medicare Other | Admitting: Surgery

## 2012-03-01 ENCOUNTER — Encounter (INDEPENDENT_AMBULATORY_CARE_PROVIDER_SITE_OTHER): Payer: Self-pay | Admitting: Surgery

## 2012-03-01 VITALS — BP 144/82 | HR 100 | Temp 97.0°F | Resp 20 | Ht 62.5 in | Wt 285.8 lb

## 2012-03-01 DIAGNOSIS — Z9889 Other specified postprocedural states: Secondary | ICD-10-CM

## 2012-03-01 DIAGNOSIS — C50419 Malignant neoplasm of upper-outer quadrant of unspecified female breast: Secondary | ICD-10-CM

## 2012-03-01 NOTE — Progress Notes (Signed)
[name]    [id number] 03/01/2012    August 23, 1936   CC: Post op lumpectomy  HPI: The patient returns for post op follow-up. She underwent a right lumpectomy and sentinel node on 02/15/2012. Over all she feels that she is doing well. She has had no pain and feels well  PE: The incision is healing nicely and there is no evidence of infection or hematoma.    DATA REVIEWED: Pathology report showed 1.4 cm cancer and negative SLN X1  IMPRESSION: Patient doing well.   PLAN: Her next visit will be in three months. She is scheduled to see Dr Welton Flakes in a few days.

## 2012-03-03 ENCOUNTER — Ambulatory Visit (HOSPITAL_BASED_OUTPATIENT_CLINIC_OR_DEPARTMENT_OTHER): Payer: Medicare Other | Admitting: Oncology

## 2012-03-03 ENCOUNTER — Encounter: Payer: Self-pay | Admitting: Oncology

## 2012-03-03 ENCOUNTER — Encounter: Payer: Self-pay | Admitting: *Deleted

## 2012-03-03 ENCOUNTER — Telehealth: Payer: Self-pay | Admitting: Oncology

## 2012-03-03 VITALS — BP 151/72 | HR 80 | Temp 97.8°F | Ht 62.5 in | Wt 285.8 lb

## 2012-03-03 DIAGNOSIS — Z170 Estrogen receptor positive status [ER+]: Secondary | ICD-10-CM

## 2012-03-03 DIAGNOSIS — C50419 Malignant neoplasm of upper-outer quadrant of unspecified female breast: Secondary | ICD-10-CM

## 2012-03-03 MED ORDER — ANASTROZOLE 1 MG PO TABS: 1.0000 mg | ORAL_TABLET | Freq: Every day | ORAL | Status: AC

## 2012-03-03 NOTE — Telephone Encounter (Signed)
Pt has her July 2013 appt calendar °

## 2012-03-03 NOTE — Progress Notes (Signed)
OFFICE PROGRESS NOTE  CC  Kayla Qua, MD, MD [address] [place] [address] Dr. Cyndia Bent Dr. Chipper Herb  DIAGNOSIS: 76 year old female with stage IA invasive ductal carcinoma that is ER positive. Patient is status post lumpectomy with sentinel node biopsy.  PRIOR THERAPY:  #1 patient was seen in the multidisciplinary breast clinic for a T1 NX MX invasive ductal carcinoma that was ER positive HER-2/neu negative.  #2 patient has gone on to have a Right breast lumpectomy and sentinel node on 02/15/2012. Her final pathology revealed a 1.4 cm invasive cancer one sentinel node was negative for metastatic disease tumor was ER positive.  #3 Oncotype DX was sent her with breast cancer recurrence score is 13. This gives her a 9% average rate of distant recurrence at 5 years with use of a hormonal agent.  #4 patient's case was discussed at the multi-disciplinary breast conference this week. Radiation therapy was not recommended by Dr. Dayton Scrape. Therefore she will proceed with adjuvant antiestrogen therapy consisting of Arimidex 1 mg daily. A total of 5 years therapy is planned.  CURRENT THERAPY:Patient will begin Arimidex 1 mg daily 03/03/2012.  INTERVAL HISTORY: [name] 76 y.o. female returns for Followup visit after her lumpectomy. Overall her incision has healed very nicely. She was seen by Dr. Jamey Ripa on the 17th he is quite pleased with patient's postop course. He discussed the pathology with her. I have discussed the Oncotype DX score with her. She understands that she has a 9% risk of developing distant recurrence at 5 years with antiestrogen therapy alone. She does not require any kind of chemotherapy the benefits are not tear and the risks would certainly far outweigh the benefits that she may have from chemotherapy. Today she overall feels well she does have her usual aches and pains secondary to her fibromyalgia. She otherwise denies any nausea vomiting fevers  chills night sweats. She has no pain at the incision site in the right breast. Remainder of the 10 point review of systems is negative.  MEDICAL HISTORY: Past Medical History  Diagnosis Date  . Hypertension   . Fibromyalgia   . PONV (postoperative nausea and vomiting)   . Recurrent upper respiratory infection (URI)     have a cold which is ending  . Arthritis   . Sleep apnea     uses CPAP at night  . High cholesterol   . Restless leg syndrome   . Shortness of breath on exertion   . Type II diabetes mellitus   . H/O hiatal hernia   . Chronic lower back pain     ALLERGIES:   has no known allergies.  MEDICATIONS:  Current Outpatient Prescriptions  Medication Sig Dispense Refill  . aspirin EC 81 MG tablet Take 81 mg by mouth every evening.      . calcium carbonate (OS-CAL) 600 MG TABS Take 600 mg by mouth 2 (two) times daily with a meal.      . furosemide (LASIX) 20 MG tablet Take 20 mg by mouth daily.       Marland Kitchen HYDROcodone-acetaminophen (VICODIN) 5-500 MG per tablet Take 1 tablet by mouth every 6 (six) hours as needed. For pain      . Liraglutide (VICTOZA) 18 MG/3ML SOLN Inject 1.6 mg into the skin daily.      Marland Kitchen lisinopril (PRINIVIL,ZESTRIL) 20 MG tablet Take 60 mg by mouth daily.      . meloxicam (MOBIC) 7.5 MG tablet Take 7.5 mg by mouth daily.      Marland Kitchen  metFORMIN (GLUCOPHAGE) 1000 MG tablet Take 1,000 mg by mouth 2 (two) times daily with a meal.      . metoprolol tartrate (LOPRESSOR) 25 MG tablet Take 25 mg by mouth 2 (two) times daily.      Marland Kitchen nystatin (MYCOSTATIN) powder Apply 1 g topically 3 (three) times daily as needed. For fungus      . rOPINIRole (REQUIP) 2 MG tablet Take 2-6 mg by mouth 4 (four) times daily -  before meals and at bedtime. 1 tablet with meals and 3 tablets at bedtime      . simvastatin (ZOCOR) 40 MG tablet Take 40 mg by mouth every evening.      . solifenacin (VESICARE) 5 MG tablet Take 5 mg by mouth daily.       . traZODone (DESYREL) 50 MG tablet Take 100 mg  by mouth at bedtime.         SURGICAL HISTORY:  Past Surgical History  Procedure Date  . Mastectomy partial / lumpectomy w/ axillary lymphadenectomy 02/15/12    right  . Tonsillectomy and adenoidectomy 1954  . Appendectomy 1954  . Cholecystectomy circa 1978    Open - done in [place]  . Vaginal hysterectomy ~ 1974  . Rotator cuff repair ~ 2011; ~ 2008    right; left  . Joint replacement   . Replacement total knee bilateral 12/2002; 01/2010    left; right  . Shoulder open rotator cuff repair 10/2007    redo  . Breast cyst excision 1950's    right  . Cataract extraction w/ intraocular lens  implant, bilateral 2012  . Carpal tunnel release 2000's    bilaterally  . Breast lumpectomy     right    REVIEW OF SYSTEMS:  Pertinent items are noted in HPI.   PHYSICAL EXAMINATION: General appearance: alert, cooperative and appears stated age Neck: no adenopathy, no carotid bruit, no JVD, supple, symmetrical, trachea midline and thyroid not enlarged, symmetric, no tenderness/mass/nodules Lymph nodes: Cervical, supraclavicular, and axillary nodes normal. Resp: clear to auscultation bilaterally and normal percussion bilaterally Back: symmetric, no curvature. ROM normal. No CVA tenderness. Cardio: regular rate and rhythm and S1, S2 normal GI: soft, non-tender; bowel sounds normal; no masses,  no organomegaly Extremities: extremities normal, atraumatic, no cyanosis or edema Neurologic: Grossly normal  ECOG PERFORMANCE STATUS: 1 - Symptomatic but completely ambulatory  Blood pressure 151/72, pulse 80, temperature 97.8 F (36.6 C), height 5' 2.5" (1.588 m), weight 285 lb 12.8 oz (129.638 kg).  LABORATORY DATA: Lab Results  Component Value Date   WBC 8.7 02/04/2012   HGB 12.9 02/04/2012   HCT 39.5 02/04/2012   MCV 82.8 02/04/2012   PLT 218 02/04/2012      Chemistry      Component Value Date/Time   NA 140 02/04/2012 0956   K 4.6 02/04/2012 0956   CL 102 02/04/2012 0956   CO2 28 02/04/2012  0956   BUN 20 02/04/2012 0956   CREATININE 1.10 02/04/2012 0956      Component Value Date/Time   CALCIUM 9.7 02/04/2012 0956   ALKPHOS 52 01/19/2012 0808   AST 15 01/19/2012 0808   ALT 15 01/19/2012 0808   BILITOT 0.4 01/19/2012 0808       RADIOGRAPHIC STUDIES:  Dg Chest 2 View  02/04/2012  *RADIOLOGY REPORT*  Clinical Data: Preadmission  CHEST - 2 VIEW  Comparison: Report 11/26/2003 no images available  Findings: Cardiomediastinal silhouette is unremarkable.  No acute infiltrate or pleural effusion.  No  pulmonary edema.  Mild degenerative changes thoracic spine.  IMPRESSION: No active disease.  Original Report Authenticated By: Natasha Mead, M.D.   Mm Breast Surgical Specimen  02/15/2012  *RADIOLOGY REPORT*  Clinical Data:  Right breast carcinoma  RIGHT BREAST NEEDLE LOCALIZATION WITH MAMMOGRAPHIC GUIDANCE AND SPECIMEN RADIOGRAPH  Patient presents for needle localization prior to surgical excision.  The patient and I discussed the procedure of needle localization including benefits and alternatives.  We discussed the high likelihood of a successful procedure.  We discussed the risks of the procedure, including infection, bleeding, tissue injury and further surgery.  Informed written consent was given.  Using mammographic guidance, sterile technique, 2% lidocaine and a 5 cm modified Kopans needle, the mass in the right upper outer quadrant posteriorly was localized using a lateromedial approach. Films were labeled and sent with the patient to surgery.  She tolerated the procedure well.  Specimen radiograph was performed at [facility] operating room and confirms the mass, clip and wire to be present in the tissue sample.  The specimen is marked for pathology.  IMPRESSION: Needle localization right breast.  No apparent complications.  Original Report Authenticated By: Daryl Eastern, M.D.   Mm Breast Wire Localization Right  02/15/2012  *RADIOLOGY REPORT*  Clinical Data:  Right breast carcinoma   RIGHT BREAST NEEDLE LOCALIZATION WITH MAMMOGRAPHIC GUIDANCE AND SPECIMEN RADIOGRAPH  Patient presents for needle localization prior to surgical excision.  The patient and I discussed the procedure of needle localization including benefits and alternatives.  We discussed the high likelihood of a successful procedure.  We discussed the risks of the procedure, including infection, bleeding, tissue injury and further surgery.  Informed written consent was given.  Using mammographic guidance, sterile technique, 2% lidocaine and a 5 cm modified Kopans needle, the mass in the right upper outer quadrant posteriorly was localized using a lateromedial approach. Films were labeled and sent with the patient to surgery.  She tolerated the procedure well.  Specimen radiograph was performed at [facility] operating room and confirms the mass, clip and wire to be present in the tissue sample.  The specimen is marked for pathology.  IMPRESSION: Needle localization right breast.  No apparent complications.  Original Report Authenticated By: Daryl Eastern, M.D.    ASSESSMENT: 76 year old female with  #1 stage IA invasive ductal carcinoma of the right breast status post lumpectomy with sentinel node biopsy on 02/15/2012. The final pathology revealed a 1.4 cm invasive cancer that was ER positive HER-2/neu negative. Oncotype DX showed a score of only 13 during her in the low risk category. Patient is therefore recommended antiestrogen therapy only. Risks and benefits of Arimidex were discussed with her literature has been given to her.   PLAN:   #1 Arimidex prescription was sent to her pharmacy.  #2 she will be seen back in 3 months time for followup.  #3 she knows to call with any problems questions or concerns.   All questions were answered. The patient knows to call the clinic with any problems, questions or concerns. We can certainly see the patient much sooner if necessary.  I spent 30 minutes  counseling the patient face to face. The total time spent in the appointment was 30 minutes.    Drue Second, MD Medical/Oncology [facility] [phone] (beeper) [phone] (Office)  03/03/2012, 12:18 PM

## 2012-03-03 NOTE — Patient Instructions (Signed)
1. You have stage I breast cancer. Your Oncotype Dx (breast cancer recurrence score) was in the low risk category. You will only be treated with anti-estrogen therapy(pills)  2. I have sent Arimidex (your cancer treatment pill) to the pharmacy.  3. We discussed the risks and benefits of the arimidex and they are also below:  Anastrozole tablets What is this medicine? ANASTROZOLE (an AS troe zole) is used to treat breast cancer in women who have gone through menopause. Some types of breast cancer depend on estrogen to grow, and this medicine can stop tumor growth by blocking estrogen production. This medicine may be used for other purposes; ask your health care provider or pharmacist if you have questions. What should I tell my health care provider before I take this medicine? They need to know if you have any of these conditions: -liver disease -an unusual or allergic reaction to anastrozole, other medicines, foods, dyes, or preservatives -pregnant or trying to get pregnant -breast-feeding How should I use this medicine? Take this medicine by mouth with a glass of water. Follow the directions on the prescription label. You can take this medicine with or without food. Take your doses at regular intervals. Do not take your medicine more often than directed. Do not stop taking except on the advice of your doctor or health care professional. Talk to your pediatrician regarding the use of this medicine in children. Special care may be needed. Overdosage: If you think you have taken too much of this medicine contact a poison control center or emergency room at once. NOTE: This medicine is only for you. Do not share this medicine with others. What if I miss a dose? If you miss a dose, take it as soon as you can. If it is almost time for your next dose, take only that dose. Do not take double or extra doses. What may interact with this medicine? Do not take this medicine with any of the following  medications: -female hormones, like estrogens or progestins and birth control pills This medicine may also interact with the following medications: -tamoxifen This list may not describe all possible interactions. Give your health care provider a list of all the medicines, herbs, non-prescription drugs, or dietary supplements you use. Also tell them if you smoke, drink alcohol, or use illegal drugs. Some items may interact with your medicine. What should I watch for while using this medicine? Visit your doctor or health care professional for regular checks on your progress. Let your doctor or health care professional know about any unusual vaginal bleeding. Do not treat yourself for diarrhea, nausea, vomiting or other side effects. Ask your doctor or health care professional for advice. What side effects may I notice from receiving this medicine? Side effects that you should report to your doctor or health care professional as soon as possible: -allergic reactions like skin rash, itching or hives, swelling of the face, lips, or tongue -any new or unusual symptoms -breathing problems -chest pain -leg pain or swelling -vomiting Side effects that usually do not require medical attention (report to your doctor or health care professional if they continue or are bothersome): -back or bone pain -cough, or throat infection -diarrhea or constipation -dizziness -headache -hot flashes -loss of appetite -nausea -sweating -weakness and tiredness -weight gain This list may not describe all possible side effects. Call your doctor for medical advice about side effects. You may report side effects to FDA at 1-800-FDA-1088. Where should I keep my medicine? Keep out  of the reach of children. Store at room temperature between 20 and 25 degrees C (68 and 77 degrees F). Throw away any unused medicine after the expiration date. NOTE: This sheet is a summary. It may not cover all possible information. If you  have questions about this medicine, talk to your doctor, pharmacist, or health care provider.  2012, [employer] Standard. (01/12/2008 4:31:52 PM)  4. I will see you back in 3 months time., Please call with any problems at [phone] and ask for the triage nurse or Dr. Rosine Beat nurse

## 2012-03-03 NOTE — Progress Notes (Signed)
Received Oncoytpe Dx results of 13.  Gave copy to MD.  Took copy to Med Rec to scan. 

## 2012-04-17 ENCOUNTER — Encounter (INDEPENDENT_AMBULATORY_CARE_PROVIDER_SITE_OTHER): Payer: Self-pay | Admitting: Surgery

## 2012-06-02 ENCOUNTER — Other Ambulatory Visit: Payer: Medicare Other | Admitting: Lab

## 2012-06-02 ENCOUNTER — Ambulatory Visit: Payer: Medicare Other | Admitting: Oncology

## 2012-07-07 ENCOUNTER — Ambulatory Visit (INDEPENDENT_AMBULATORY_CARE_PROVIDER_SITE_OTHER): Payer: Medicare Other | Admitting: Surgery

## 2012-07-07 ENCOUNTER — Encounter (INDEPENDENT_AMBULATORY_CARE_PROVIDER_SITE_OTHER): Payer: Self-pay | Admitting: Surgery

## 2012-07-07 VITALS — BP 152/82 | HR 74 | Temp 97.3°F | Resp 16 | Ht 63.5 in | Wt 289.1 lb

## 2012-07-07 DIAGNOSIS — Z853 Personal history of malignant neoplasm of breast: Secondary | ICD-10-CM

## 2012-07-07 NOTE — Progress Notes (Signed)
NAME: Shamar C Gallardo       DOB: 19-Jul-1936           DATE: 07/07/2012       MRN: [id number]   Markisha C Kraai is a 76 y.o.Marland Kitchenfemale who presents for routine followup of her Right breast cancer,diagnosed in 2012 and treated with lumpectomy and anti-estrogen, no radiation. She has no problems or concerns on either side.  PFSH: She has had no significant changes since the last visit here.  ROS: There have been no significant changes since the last visit here  EXAM:  VS: BP 152/82  Pulse 74  Temp 97.3 F (36.3 C) (Temporal)  Resp 16  Ht 5' 3.5" (1.613 m)  Wt 289 lb 2 oz (131.146 kg)  BMI 50.41 kg/m2   General: The patient is alert, oriented, generally healthy appearing, NAD. Mood and affect are normal.  Breasts:  Symmetric, large, pendulous, no mass, lumpectomy iste well healed and no problems  Lymphatics: She has no axillary or supraclavicular adenopathy on either side.  Extremities: Full ROM of the surgical side with no lymphedema noted.  Data Reviewed: Note in Epic  Impression: Doing well, with no evidence of recurrent cancer or new cancer  Plan: Will continue to follow up on an annual basis here. Next visit in April for a one year follow up

## 2012-07-07 NOTE — Patient Instructions (Signed)
See me again in April for a one year follow up of your breast cancer. Continue to have annual mamograms

## 2012-07-10 ENCOUNTER — Ambulatory Visit (HOSPITAL_BASED_OUTPATIENT_CLINIC_OR_DEPARTMENT_OTHER): Payer: Medicare Other | Admitting: Oncology

## 2012-07-10 ENCOUNTER — Encounter: Payer: Self-pay | Admitting: Oncology

## 2012-07-10 ENCOUNTER — Other Ambulatory Visit (HOSPITAL_BASED_OUTPATIENT_CLINIC_OR_DEPARTMENT_OTHER): Payer: Medicare Other | Admitting: Lab

## 2012-07-10 ENCOUNTER — Telehealth: Payer: Self-pay | Admitting: Oncology

## 2012-07-10 VITALS — BP 173/69 | HR 80 | Temp 97.7°F | Resp 20 | Ht 63.5 in | Wt 291.0 lb

## 2012-07-10 DIAGNOSIS — C50419 Malignant neoplasm of upper-outer quadrant of unspecified female breast: Secondary | ICD-10-CM

## 2012-07-10 DIAGNOSIS — Z170 Estrogen receptor positive status [ER+]: Secondary | ICD-10-CM

## 2012-07-10 DIAGNOSIS — G8929 Other chronic pain: Secondary | ICD-10-CM

## 2012-07-10 LAB — CBC WITH DIFFERENTIAL/PLATELET
Basophils Absolute: 0 10*3/uL (ref 0.0–0.1)
EOS%: 1.6 % (ref 0.0–7.0)
Eosinophils Absolute: 0.1 10*3/uL (ref 0.0–0.5)
LYMPH%: 20.6 % (ref 14.0–49.7)
MCH: 27.6 pg (ref 25.1–34.0)
MCV: 82.2 fL (ref 79.5–101.0)
MONO%: 7.3 % (ref 0.0–14.0)
NEUT#: 4.9 10*3/uL (ref 1.5–6.5)
Platelets: 188 10*3/uL (ref 145–400)
RBC: 4.23 10*6/uL (ref 3.70–5.45)
RDW: 16.1 % — ABNORMAL HIGH (ref 11.2–14.5)

## 2012-07-10 LAB — COMPREHENSIVE METABOLIC PANEL (CC13)
AST: 15 U/L (ref 5–34)
Alkaline Phosphatase: 47 U/L (ref 40–150)
BUN: 23 mg/dL (ref 7.0–26.0)
Glucose: 92 mg/dl (ref 70–99)
Potassium: 4.2 mEq/L (ref 3.5–5.1)
Sodium: 142 mEq/L (ref 136–145)
Total Bilirubin: 0.5 mg/dL (ref 0.20–1.20)

## 2012-07-10 NOTE — Telephone Encounter (Signed)
gve the pt her feb 2014 appt calendar °

## 2012-07-10 NOTE — Patient Instructions (Addendum)
You are doing well, continue taking  arimidex as you on a daily basis   I will see you back in 6 months

## 2012-07-10 NOTE — Progress Notes (Signed)
OFFICE PROGRESS NOTE  CC  [facility], [name] [address] [place] [address] Dr. Cyndia Bent Dr. Chipper Herb  DIAGNOSIS: 76 year old female with stage IA invasive ductal carcinoma that is ER positive. Patient is status post lumpectomy with sentinel node biopsy.  PRIOR THERAPY:  #1 patient was seen in the multidisciplinary breast clinic for a T1 NX MX invasive ductal carcinoma that was ER positive HER-2/neu negative.  #2 patient has gone on to have a Right breast lumpectomy and sentinel node on 02/15/2012. Her final pathology revealed a 1.4 cm invasive cancer one sentinel node was negative for metastatic disease tumor was ER positive.  #3 Oncotype DX was sent her with breast cancer recurrence score is 13. This gives her a 9% average rate of distant recurrence at 5 years with use of a hormonal agent.  #4 patient's case was discussed at the multi-disciplinary breast conference this week. Radiation therapy was not recommended by Dr. Dayton Scrape. Therefore she is on  adjuvant antiestrogen therapy consisting of Arimidex 1 mg daily. A total of 5 years therapy is planned.  CURRENT THERAPY: Arimidex 1 mg daily 03/03/2012.  INTERVAL HISTORY: [name] 76 y.o. female returns for Followup visit today. She is tolerating the arimidex well. No side effects are reported, no nausea or vomiting, no aches or pains no arthragias, no fevers or chills, no vaginal discharge. Remainder of the 10 point review of systems is negative.  MEDICAL HISTORY: Past Medical History  Diagnosis Date  . Hypertension   . Fibromyalgia   . PONV (postoperative nausea and vomiting)   . Recurrent upper respiratory infection (URI)     have a cold which is ending  . Arthritis   . Sleep apnea     uses CPAP at night  . High cholesterol   . Restless leg syndrome   . Shortness of breath on exertion   . Type II diabetes mellitus   . H/O hiatal hernia   . Chronic lower back pain     ALLERGIES:   has no known  allergies.  MEDICATIONS:  Current Outpatient Prescriptions  Medication Sig Dispense Refill  . anastrozole (ARIMIDEX) 1 MG tablet Daily.      Marland Kitchen aspirin EC 81 MG tablet Take 81 mg by mouth every evening.      . calcium carbonate (OS-CAL) 600 MG TABS Take 600 mg by mouth 2 (two) times daily with a meal.      . furosemide (LASIX) 20 MG tablet Take 20 mg by mouth daily.       Marland Kitchen HYDROcodone-acetaminophen (VICODIN) 5-500 MG per tablet Take 1 tablet by mouth every 6 (six) hours as needed. For pain      . Liraglutide (VICTOZA) 18 MG/3ML SOLN Inject 1.6 mg into the skin daily.      Marland Kitchen lisinopril (PRINIVIL,ZESTRIL) 20 MG tablet Take 60 mg by mouth daily.      . meloxicam (MOBIC) 7.5 MG tablet Take 7.5 mg by mouth daily.      . metFORMIN (GLUCOPHAGE) 1000 MG tablet Take 1,000 mg by mouth 2 (two) times daily with a meal.      . metoprolol tartrate (LOPRESSOR) 25 MG tablet Take 25 mg by mouth 2 (two) times daily.      Marland Kitchen nystatin (MYCOSTATIN) powder Apply 1 g topically 3 (three) times daily as needed. For fungus      . oxybutynin (DITROPAN) 5 MG tablet Twice daily.      Marland Kitchen rOPINIRole (REQUIP) 2 MG tablet Take  2-6 mg by mouth 4 (four) times daily -  before meals and at bedtime. 1 tablet with meals and 3 tablets at bedtime      . simvastatin (ZOCOR) 40 MG tablet Take 40 mg by mouth every evening.      . solifenacin (VESICARE) 5 MG tablet Take 5 mg by mouth daily.       . traZODone (DESYREL) 50 MG tablet Take 100 mg by mouth at bedtime.         SURGICAL HISTORY:  Past Surgical History  Procedure Date  . Mastectomy partial / lumpectomy w/ axillary lymphadenectomy 02/15/12    right  . Tonsillectomy and adenoidectomy 1954  . Appendectomy 1954  . Cholecystectomy circa 1978    Open - done in [place]  . Vaginal hysterectomy ~ 1974  . Rotator cuff repair ~ 2011; ~ 2008    right; left  . Joint replacement   . Replacement total knee bilateral 12/2002; 01/2010    left; right  . Shoulder open rotator cuff repair  10/2007    redo  . Breast cyst excision 1950's    right  . Cataract extraction w/ intraocular lens  implant, bilateral 2012  . Carpal tunnel release 2000's    bilaterally  . Breast lumpectomy     right    REVIEW OF SYSTEMS:  Pertinent items are noted in HPI.   PHYSICAL EXAMINATION: General appearance: alert, cooperative and appears stated age Neck: no adenopathy, no carotid bruit, no JVD, supple, symmetrical, trachea midline and thyroid not enlarged, symmetric, no tenderness/mass/nodules Lymph nodes: Cervical, supraclavicular, and axillary nodes normal. Resp: clear to auscultation bilaterally and normal percussion bilaterally Back: symmetric, no curvature. ROM normal. No CVA tenderness. Cardio: regular rate and rhythm and S1, S2 normal GI: soft, non-tender; bowel sounds normal; no masses,  no organomegaly Extremities: extremities normal, atraumatic, no cyanosis or edema Neurologic: Grossly normal Bilateral breast exam:well healed incisional scar on right no local recurrence, left breast no masses ECOG PERFORMANCE STATUS: 1 - Symptomatic but completely ambulatory  Blood pressure 173/69, pulse 80, temperature 97.7 F (36.5 C), temperature source Oral, resp. rate 20, height 5' 3.5" (1.613 m), weight 291 lb (131.997 kg).  LABORATORY DATA: Lab Results  Component Value Date   WBC 6.9 07/10/2012   HGB 11.7 07/10/2012   HCT 34.8 07/10/2012   MCV 82.2 07/10/2012   PLT 188 07/10/2012      Chemistry      Component Value Date/Time   NA 140 02/04/2012 0956   K 4.6 02/04/2012 0956   CL 102 02/04/2012 0956   CO2 28 02/04/2012 0956   BUN 20 02/04/2012 0956   CREATININE 1.10 02/04/2012 0956      Component Value Date/Time   CALCIUM 9.7 02/04/2012 0956   ALKPHOS 52 01/19/2012 0808   AST 15 01/19/2012 0808   ALT 15 01/19/2012 0808   BILITOT 0.4 01/19/2012 0808       RADIOGRAPHIC STUDIES:  Dg Chest 2 View  02/04/2012  *RADIOLOGY REPORT*  Clinical Data: Preadmission  CHEST - 2 VIEW  Comparison:  Report 11/26/2003 no images available  Findings: Cardiomediastinal silhouette is unremarkable.  No acute infiltrate or pleural effusion.  No pulmonary edema.  Mild degenerative changes thoracic spine.  IMPRESSION: No active disease.  Original Report Authenticated By: Natasha Mead, M.D.   Mm Breast Surgical Specimen  02/15/2012  *RADIOLOGY REPORT*  Clinical Data:  Right breast carcinoma  RIGHT BREAST NEEDLE LOCALIZATION WITH MAMMOGRAPHIC GUIDANCE AND SPECIMEN RADIOGRAPH  Patient presents  for needle localization prior to surgical excision.  The patient and I discussed the procedure of needle localization including benefits and alternatives.  We discussed the high likelihood of a successful procedure.  We discussed the risks of the procedure, including infection, bleeding, tissue injury and further surgery.  Informed written consent was given.  Using mammographic guidance, sterile technique, 2% lidocaine and a 5 cm modified Kopans needle, the mass in the right upper outer quadrant posteriorly was localized using a lateromedial approach. Films were labeled and sent with the patient to surgery.  She tolerated the procedure well.  Specimen radiograph was performed at [facility] operating room and confirms the mass, clip and wire to be present in the tissue sample.  The specimen is marked for pathology.  IMPRESSION: Needle localization right breast.  No apparent complications.  Original Report Authenticated By: Daryl Eastern, M.D.   Mm Breast Wire Localization Right  02/15/2012  *RADIOLOGY REPORT*  Clinical Data:  Right breast carcinoma  RIGHT BREAST NEEDLE LOCALIZATION WITH MAMMOGRAPHIC GUIDANCE AND SPECIMEN RADIOGRAPH  Patient presents for needle localization prior to surgical excision.  The patient and I discussed the procedure of needle localization including benefits and alternatives.  We discussed the high likelihood of a successful procedure.  We discussed the risks of the procedure, including  infection, bleeding, tissue injury and further surgery.  Informed written consent was given.  Using mammographic guidance, sterile technique, 2% lidocaine and a 5 cm modified Kopans needle, the mass in the right upper outer quadrant posteriorly was localized using a lateromedial approach. Films were labeled and sent with the patient to surgery.  She tolerated the procedure well.  Specimen radiograph was performed at [facility] operating room and confirms the mass, clip and wire to be present in the tissue sample.  The specimen is marked for pathology.  IMPRESSION: Needle localization right breast.  No apparent complications.  Original Report Authenticated By: Daryl Eastern, M.D.    ASSESSMENT: 76 year old female with  #1 stage IA invasive ductal carcinoma of the right breast status post lumpectomy with sentinel node biopsy on 02/15/2012. The final pathology revealed a 1.4 cm invasive cancer that was ER positive HER-2/neu negative. Oncotype DX showed a score of only 13 during her in the low risk category. Patient is therefore recommended antiestrogen therapy only. Risks and benefits of Arimidex were discussed with her literature has been given to her.She is tolerating it well, no side effects. No evidence of recurrence   PLAN:   #1 ontinue arimidex daily tolerating well  #2 she will be seen back in 6 months time for followup.  #3 she knows to call with any problems questions or concerns.   All questions were answered. The patient knows to call the clinic with any problems, questions or concerns. We can certainly see the patient much sooner if necessary.  I spent >20 minutes counseling the patient face to face. The total time spent in the appointment was 30 minutes.    Drue Second, [name] Medical/Oncology [facility] [phone] (beeper) [phone] (Office)  07/10/2012, 11:44 AM

## 2012-07-11 ENCOUNTER — Encounter (INDEPENDENT_AMBULATORY_CARE_PROVIDER_SITE_OTHER): Payer: Medicare Other

## 2012-07-21 ENCOUNTER — Encounter (INDEPENDENT_AMBULATORY_CARE_PROVIDER_SITE_OTHER): Payer: Medicare Other | Admitting: Surgery

## 2012-12-16 IMAGING — MG MM BREAST NEEDLE LOCALIZATION*R*
4 series · 4 of 4 positions shown · non-contrast
Comparison: none

CLINICAL DATA: Right breast carcinoma

[R LM (1 of 2)]
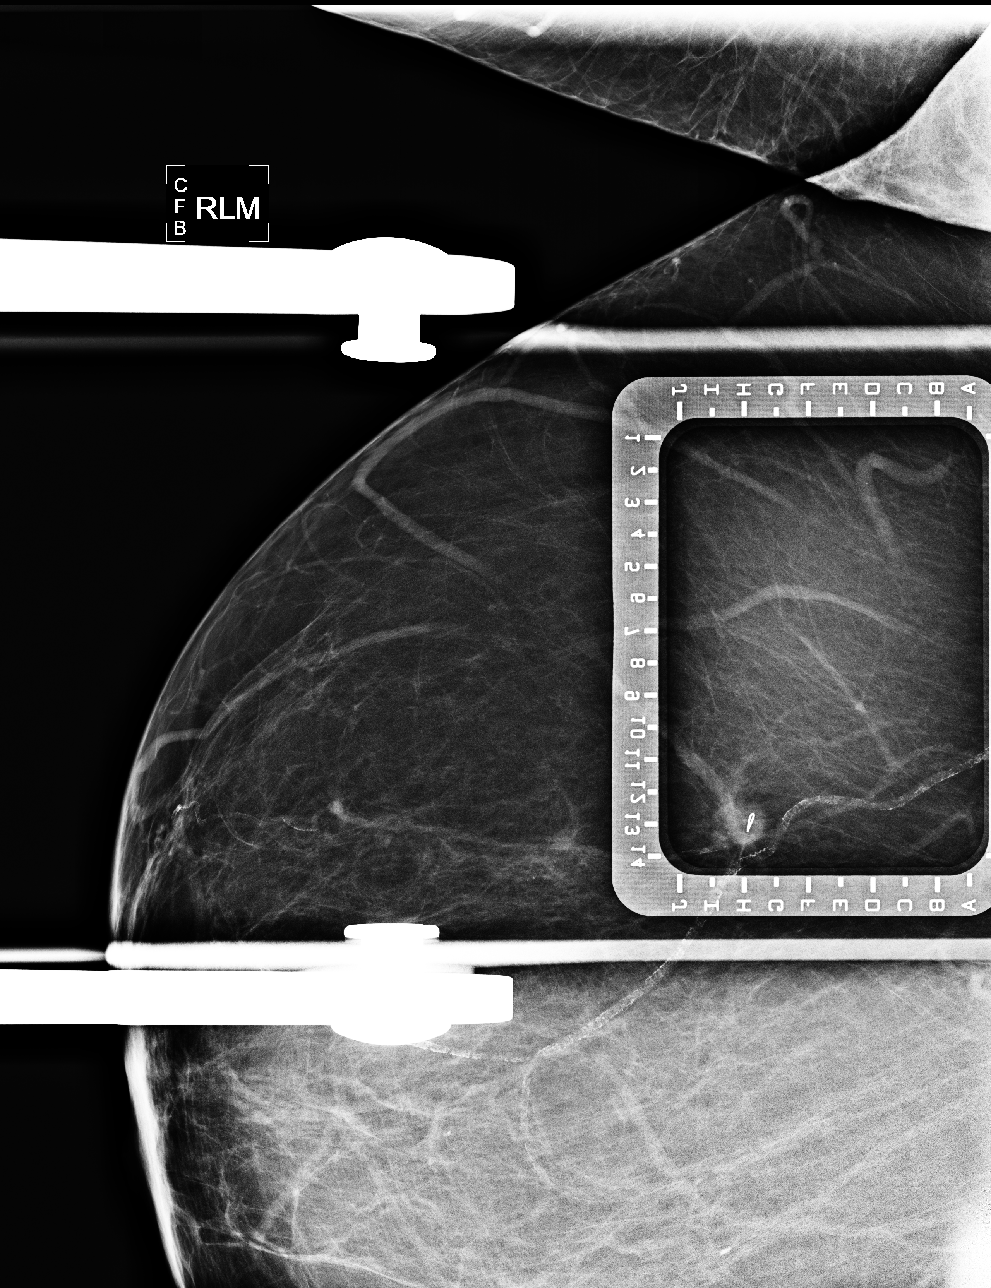

[R LM (2 of 2)]
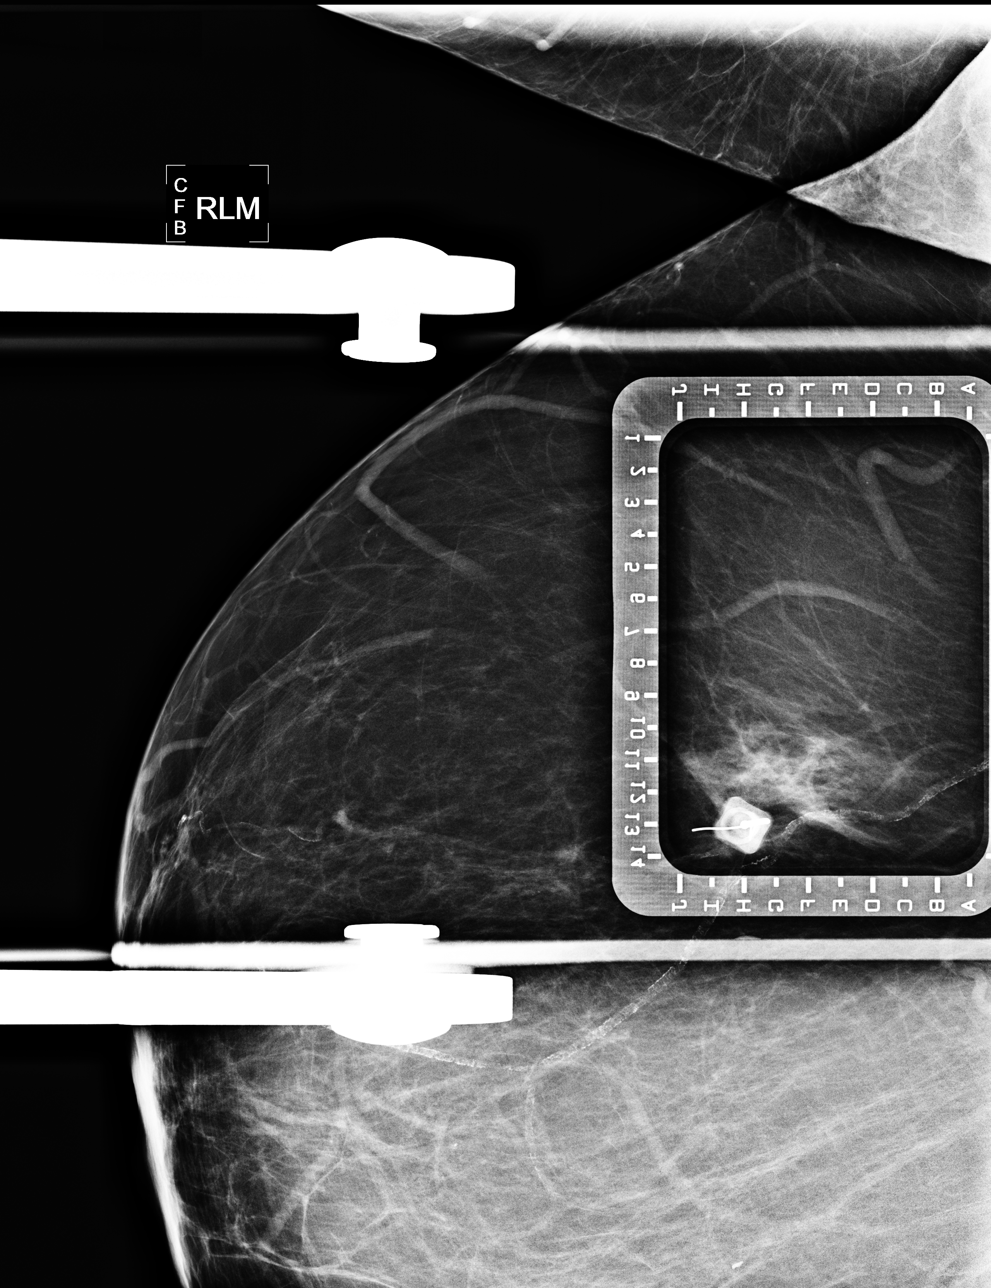

[R CC (1 of 2)]
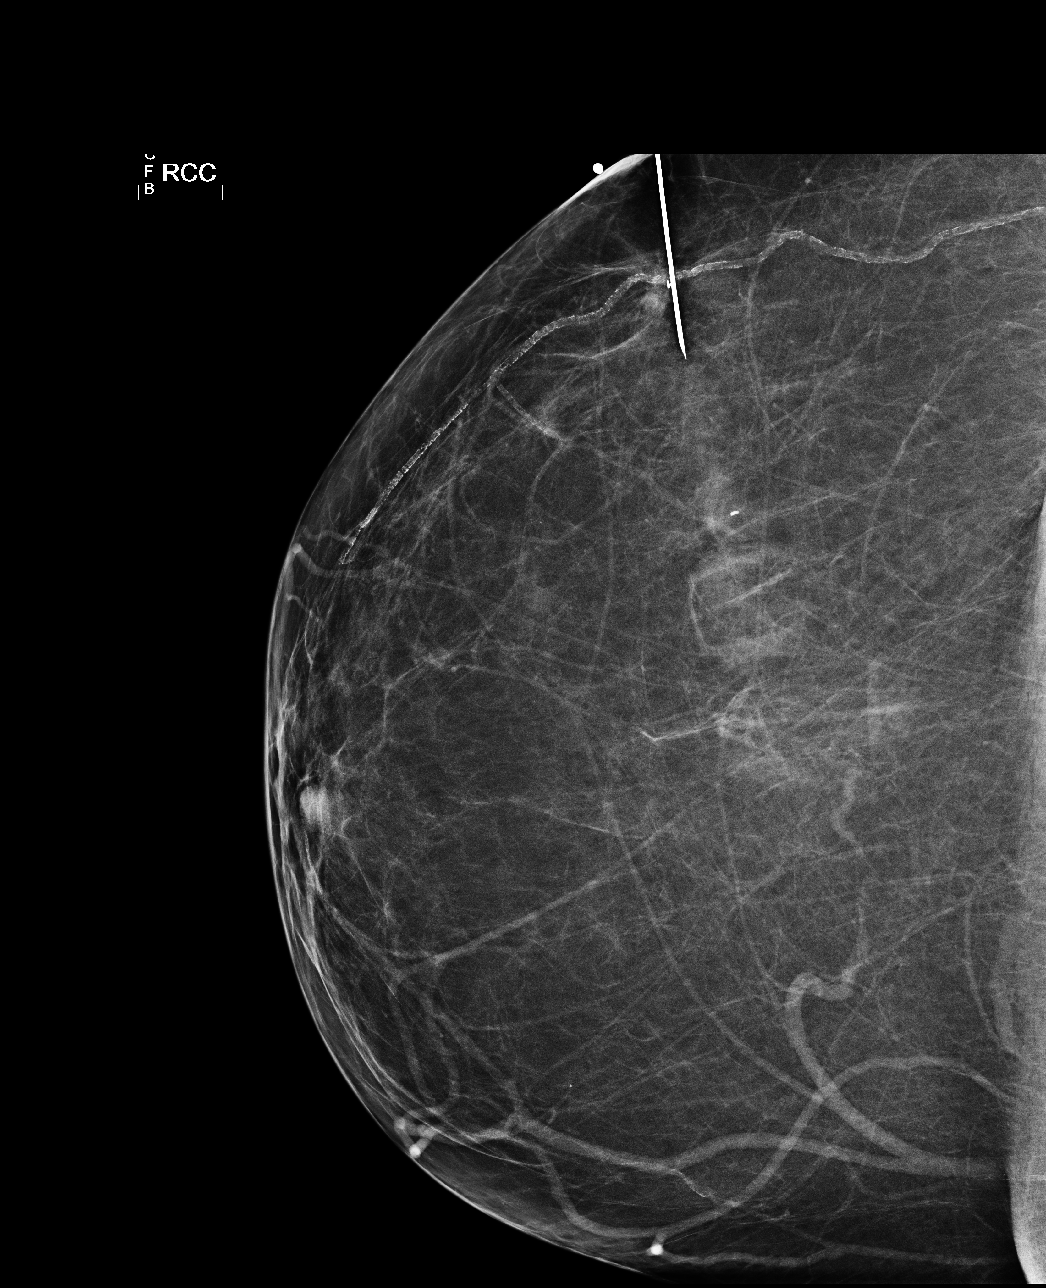

[R CC (2 of 2)]
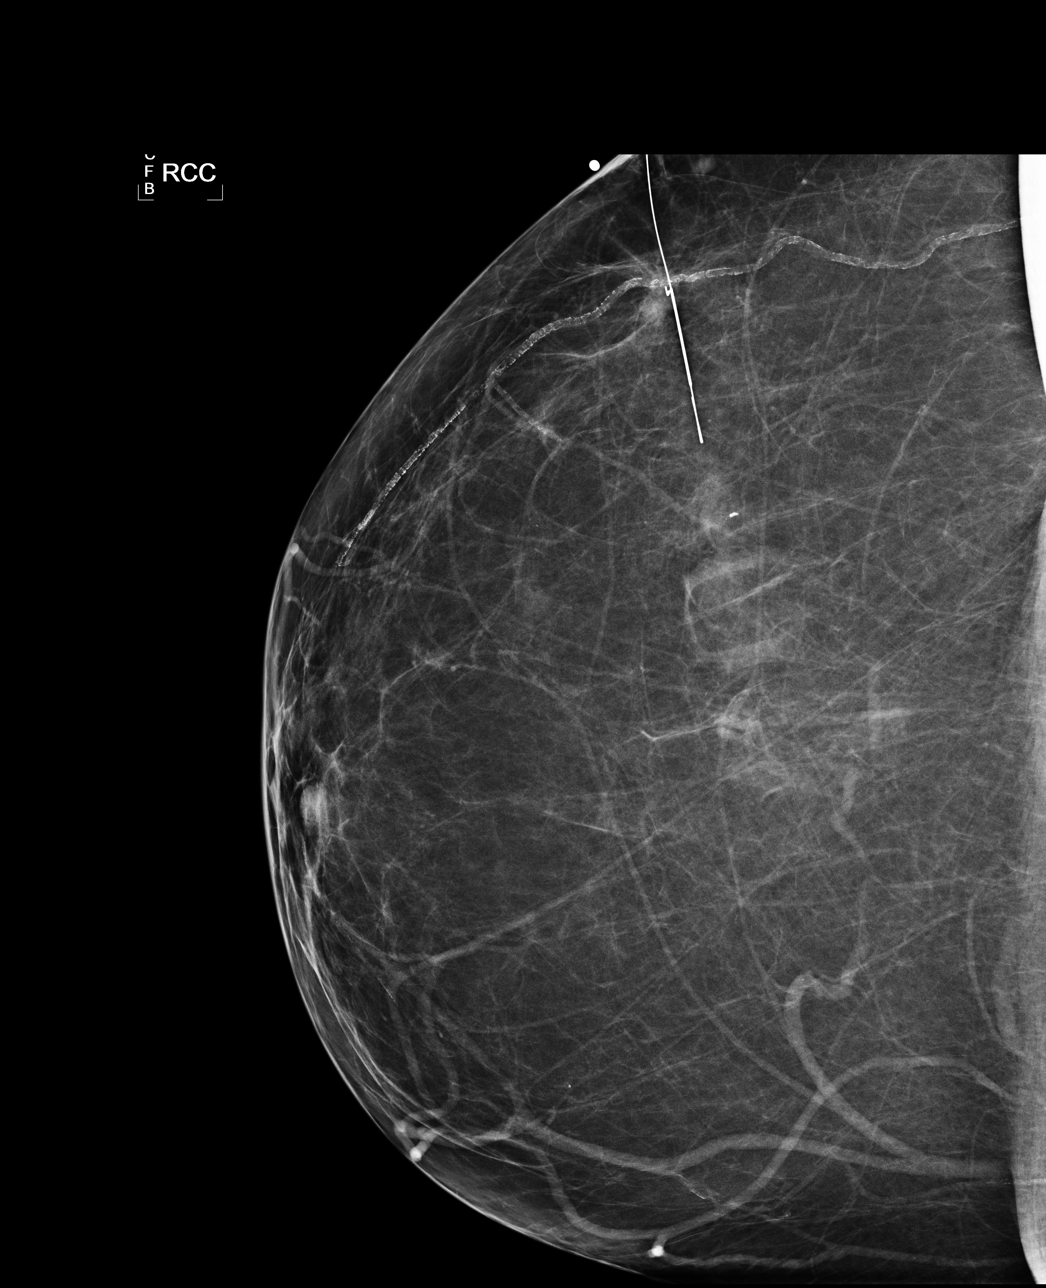

[4 of 4 positions shown; findings below may reference images not displayed]

RIGHT BREAST NEEDLE LOCALIZATION WITH MAMMOGRAPHIC GUIDANCE AND
SPECIMEN RADIOGRAPH

Patient presents for needle localization prior to surgical
excision.  The patient and I discussed the procedure of needle
localization including benefits and alternatives.  We discussed the
high likelihood of a successful procedure.  We discussed the risks
of the procedure, including infection, bleeding, tissue injury and
further surgery.  Informed written consent was given.

Using mammographic guidance, sterile technique, 2% lidocaine and a
5 cm modified Kopans needle, the mass in the right upper outer
quadrant posteriorly was localized using a lateromedial approach.
Films were labeled and sent with the patient to surgery.  She
tolerated the procedure well.

Specimen radiograph was performed at [HOSPITAL] operating
room and confirms the mass, clip and wire to be present in the
tissue sample.  The specimen is marked for pathology.
IMPRESSION: Needle localization right breast.  No apparent complications.

## 2012-12-21 ENCOUNTER — Other Ambulatory Visit: Payer: Self-pay | Admitting: Oncology

## 2012-12-21 DIAGNOSIS — Z853 Personal history of malignant neoplasm of breast: Secondary | ICD-10-CM

## 2013-01-03 ENCOUNTER — Ambulatory Visit
Admission: RE | Admit: 2013-01-03 | Discharge: 2013-01-03 | Disposition: A | Discharge status: Home / Self Care | Payer: Medicare Other | Source: Ambulatory Visit | Attending: Oncology | Admitting: Oncology

## 2013-01-03 DIAGNOSIS — Z853 Personal history of malignant neoplasm of breast: Secondary | ICD-10-CM

## 2013-01-12 ENCOUNTER — Telehealth: Payer: Self-pay | Admitting: Oncology

## 2013-01-12 ENCOUNTER — Ambulatory Visit (HOSPITAL_BASED_OUTPATIENT_CLINIC_OR_DEPARTMENT_OTHER): Payer: Medicare Other | Admitting: Oncology

## 2013-01-12 ENCOUNTER — Other Ambulatory Visit (HOSPITAL_BASED_OUTPATIENT_CLINIC_OR_DEPARTMENT_OTHER): Payer: Medicare Other | Admitting: Lab

## 2013-01-12 ENCOUNTER — Encounter: Payer: Self-pay | Admitting: Oncology

## 2013-01-12 VITALS — BP 165/80 | HR 83 | Temp 97.4°F | Resp 20 | Ht 63.5 in | Wt 283.7 lb

## 2013-01-12 DIAGNOSIS — C50419 Malignant neoplasm of upper-outer quadrant of unspecified female breast: Secondary | ICD-10-CM

## 2013-01-12 DIAGNOSIS — Z170 Estrogen receptor positive status [ER+]: Secondary | ICD-10-CM

## 2013-01-12 LAB — COMPREHENSIVE METABOLIC PANEL (CC13)
Albumin: 3.6 g/dL (ref 3.5–5.0)
Alkaline Phosphatase: 57 U/L (ref 40–150)
BUN: 28.3 mg/dL — ABNORMAL HIGH (ref 7.0–26.0)
Creatinine: 1.3 mg/dL — ABNORMAL HIGH (ref 0.6–1.1)
Glucose: 118 mg/dl — ABNORMAL HIGH (ref 70–99)
Potassium: 4 mEq/L (ref 3.5–5.1)
Total Bilirubin: 0.42 mg/dL (ref 0.20–1.20)

## 2013-01-12 LAB — CBC WITH DIFFERENTIAL/PLATELET
Basophils Absolute: 0 10*3/uL (ref 0.0–0.1)
EOS%: 3.1 % (ref 0.0–7.0)
Eosinophils Absolute: 0.2 10*3/uL (ref 0.0–0.5)
HCT: 34.9 % (ref 34.8–46.6)
HGB: 11.8 g/dL (ref 11.6–15.9)
LYMPH%: 24.1 % (ref 14.0–49.7)
MCH: 27.1 pg (ref 25.1–34.0)
MCV: 80.2 fL (ref 79.5–101.0)
MONO%: 7.4 % (ref 0.0–14.0)
NEUT#: 4.7 10*3/uL (ref 1.5–6.5)
NEUT%: 65 % (ref 38.4–76.8)
Platelets: 203 10*3/uL (ref 145–400)

## 2013-01-12 MED ORDER — ANASTROZOLE 1 MG PO TABS: 1.0000 mg | ORAL_TABLET | Freq: Every day | ORAL | Status: DC

## 2013-01-12 NOTE — Patient Instructions (Addendum)
Continue armidex daily  We will see you back in 6 months

## 2013-01-12 NOTE — Progress Notes (Signed)
OFFICE PROGRESS NOTE  CC  [facility], MD [address] [place] [address] Dr. Cyndia Bent Dr. Chipper Herb  DIAGNOSIS: 77 year old female with stage IA invasive ductal carcinoma that is ER positive. Patient is status post lumpectomy with sentinel node biopsy.  PRIOR THERAPY:  #1 patient was seen in the multidisciplinary breast clinic for a T1 NX MX invasive ductal carcinoma that was ER positive HER-2/neu negative.  #2 patient has gone on to have a Right breast lumpectomy and sentinel node on 02/15/2012. Her final pathology revealed a 1.4 cm invasive cancer one sentinel node was negative for metastatic disease tumor was ER positive.  #3 Oncotype DX was sent her with breast cancer recurrence score is 13. This gives her a 9% average rate of distant recurrence at 5 years with use of a hormonal agent.  #4 patient's case was discussed at the multi-disciplinary breast conference this week. Radiation therapy was not recommended by Dr. Dayton Scrape. Therefore she is on  adjuvant antiestrogen therapy consisting of Arimidex 1 mg daily. A total of 5 years therapy is planned.  CURRENT THERAPY: Arimidex 1 mg daily 03/03/2012.  INTERVAL HISTORY: [name] 77 y.o. female returns for Followup visit today. She is tolerating the arimidex well. No side effects are reported, no nausea or vomiting, no aches or pains no arthragias, no fevers or chills, no vaginal discharge. Remainder of the 10 point review of systems is negative.  MEDICAL HISTORY: Past Medical History  Diagnosis Date  . Hypertension   . Fibromyalgia   . PONV (postoperative nausea and vomiting)   . Recurrent upper respiratory infection (URI)     have a cold which is ending  . Arthritis   . Sleep apnea     uses CPAP at night  . High cholesterol   . Restless leg syndrome   . Shortness of breath on exertion   . Type II diabetes mellitus   . H/O hiatal hernia   . Chronic lower back pain     ALLERGIES:  has No Known  Allergies.  MEDICATIONS:  Current Outpatient Prescriptions  Medication Sig Dispense Refill  . anastrozole (ARIMIDEX) 1 MG tablet Daily.      Marland Kitchen aspirin EC 81 MG tablet Take 81 mg by mouth every evening.      . calcium carbonate (OS-CAL) 600 MG TABS Take 600 mg by mouth 2 (two) times daily with a meal.      . furosemide (LASIX) 20 MG tablet Take 20 mg by mouth daily.       Marland Kitchen HYDROcodone-acetaminophen (VICODIN) 5-500 MG per tablet Take 1 tablet by mouth every 6 (six) hours as needed. For pain      . lisinopril (PRINIVIL,ZESTRIL) 20 MG tablet Take 60 mg by mouth daily.      . meloxicam (MOBIC) 7.5 MG tablet Take 7.5 mg by mouth daily.      . metFORMIN (GLUCOPHAGE) 1000 MG tablet Take 1,000 mg by mouth 2 (two) times daily with a meal.      . metoprolol tartrate (LOPRESSOR) 25 MG tablet Take 25 mg by mouth 2 (two) times daily.      Marland Kitchen nystatin (MYCOSTATIN) powder Apply 1 g topically 3 (three) times daily as needed. For fungus      . oxybutynin (DITROPAN) 5 MG tablet Twice daily.      Marland Kitchen rOPINIRole (REQUIP) 2 MG tablet Take 2-6 mg by mouth 4 (four) times daily -  before meals and at bedtime. 1 tablet with meals  and 3 tablets at bedtime      . simvastatin (ZOCOR) 40 MG tablet Take 40 mg by mouth every evening.      . traZODone (DESYREL) 50 MG tablet Take 100 mg by mouth at bedtime.       . Liraglutide (VICTOZA) 18 MG/3ML SOLN Inject 1.6 mg into the skin daily.      . solifenacin (VESICARE) 5 MG tablet Take 5 mg by mouth daily.        No current facility-administered medications for this visit.    SURGICAL HISTORY:  Past Surgical History  Procedure Laterality Date  . Mastectomy partial / lumpectomy w/ axillary lymphadenectomy  02/15/12    right  . Tonsillectomy and adenoidectomy  1954  . Appendectomy  1954  . Cholecystectomy  circa 1978    Open - done in [place]  . Vaginal hysterectomy  ~ 1974  . Rotator cuff repair  ~ 2011; ~ 2008    right; left  . Joint replacement    . Replacement total  knee bilateral  12/2002; 01/2010    left; right  . Shoulder open rotator cuff repair  10/2007    redo  . Breast cyst excision  1950's    right  . Cataract extraction w/ intraocular lens  implant, bilateral  2012  . Carpal tunnel release  2000's    bilaterally  . Breast lumpectomy      right    REVIEW OF SYSTEMS:  Pertinent items are noted in HPI.   PHYSICAL EXAMINATION: General appearance: alert, cooperative and appears stated age Neck: no adenopathy, no carotid bruit, no JVD, supple, symmetrical, trachea midline and thyroid not enlarged, symmetric, no tenderness/mass/nodules Lymph nodes: Cervical, supraclavicular, and axillary nodes normal. Resp: clear to auscultation bilaterally and normal percussion bilaterally Back: symmetric, no curvature. ROM normal. No CVA tenderness. Cardio: regular rate and rhythm and S1, S2 normal GI: soft, non-tender; bowel sounds normal; no masses,  no organomegaly Extremities: extremities normal, atraumatic, no cyanosis or edema Neurologic: Grossly normal Bilateral breast exam:well healed incisional scar on right no local recurrence, left breast no masses ECOG PERFORMANCE STATUS: 1 - Symptomatic but completely ambulatory  Blood pressure 165/80, pulse 83, temperature 97.4 F (36.3 C), temperature source Oral, resp. rate 20, height 5' 3.5" (1.613 m), weight 283 lb 11.2 oz (128.685 kg).  LABORATORY DATA: Lab Results  Component Value Date   WBC 7.3 01/12/2013   HGB 11.8 01/12/2013   HCT 34.9 01/12/2013   MCV 80.2 01/12/2013   PLT 203 01/12/2013      Chemistry      Component Value Date/Time   NA 143 01/12/2013 1117   NA 140 02/04/2012 0956   K 4.0 01/12/2013 1117   K 4.6 02/04/2012 0956   CL 106 01/12/2013 1117   CL 102 02/04/2012 0956   CO2 27 01/12/2013 1117   CO2 28 02/04/2012 0956   BUN 28.3* 01/12/2013 1117   BUN 20 02/04/2012 0956   CREATININE 1.3* 01/12/2013 1117   CREATININE 1.10 02/04/2012 0956      Component Value Date/Time   CALCIUM 9.1  01/12/2013 1117   CALCIUM 9.7 02/04/2012 0956   ALKPHOS 57 01/12/2013 1117   ALKPHOS 52 01/19/2012 0808   AST 17 01/12/2013 1117   AST 15 01/19/2012 0808   ALT 16 01/12/2013 1117   ALT 15 01/19/2012 0808   BILITOT 0.42 01/12/2013 1117   BILITOT 0.4 01/19/2012 0808       RADIOGRAPHIC STUDIES:  Dg Chest 2  View  02/04/2012  *RADIOLOGY REPORT*  Clinical Data: Preadmission  CHEST - 2 VIEW  Comparison: Report 11/26/2003 no images available  Findings: Cardiomediastinal silhouette is unremarkable.  No acute infiltrate or pleural effusion.  No pulmonary edema.  Mild degenerative changes thoracic spine.  IMPRESSION: No active disease.  Original Report Authenticated By: Natasha Mead, M.D.   Mm Breast Surgical Specimen  02/15/2012  *RADIOLOGY REPORT*  Clinical Data:  Right breast carcinoma  RIGHT BREAST NEEDLE LOCALIZATION WITH MAMMOGRAPHIC GUIDANCE AND SPECIMEN RADIOGRAPH  Patient presents for needle localization prior to surgical excision.  The patient and I discussed the procedure of needle localization including benefits and alternatives.  We discussed the high likelihood of a successful procedure.  We discussed the risks of the procedure, including infection, bleeding, tissue injury and further surgery.  Informed written consent was given.  Using mammographic guidance, sterile technique, 2% lidocaine and a 5 cm modified Kopans needle, the mass in the right upper outer quadrant posteriorly was localized using a lateromedial approach. Films were labeled and sent with the patient to surgery.  She tolerated the procedure well.  Specimen radiograph was performed at [facility] operating room and confirms the mass, clip and wire to be present in the tissue sample.  The specimen is marked for pathology.  IMPRESSION: Needle localization right breast.  No apparent complications.  Original Report Authenticated By: Daryl Eastern, M.D.   Mm Breast Wire Localization Right  02/15/2012  *RADIOLOGY REPORT*  Clinical Data:   Right breast carcinoma  RIGHT BREAST NEEDLE LOCALIZATION WITH MAMMOGRAPHIC GUIDANCE AND SPECIMEN RADIOGRAPH  Patient presents for needle localization prior to surgical excision.  The patient and I discussed the procedure of needle localization including benefits and alternatives.  We discussed the high likelihood of a successful procedure.  We discussed the risks of the procedure, including infection, bleeding, tissue injury and further surgery.  Informed written consent was given.  Using mammographic guidance, sterile technique, 2% lidocaine and a 5 cm modified Kopans needle, the mass in the right upper outer quadrant posteriorly was localized using a lateromedial approach. Films were labeled and sent with the patient to surgery.  She tolerated the procedure well.  Specimen radiograph was performed at [facility] operating room and confirms the mass, clip and wire to be present in the tissue sample.  The specimen is marked for pathology.  IMPRESSION: Needle localization right breast.  No apparent complications.  Original Report Authenticated By: Daryl Eastern, M.D.    ASSESSMENT: 77 year old female with  #1 stage IA invasive ductal carcinoma of the right breast status post lumpectomy with sentinel node biopsy on 02/15/2012. The final pathology revealed a 1.4 cm invasive cancer that was ER positive HER-2/neu negative. Oncotype DX showed a score of only 13 during her in the low risk category. Patient is therefore recommended antiestrogen therapy only. Risks and benefits of Arimidex were discussed with her literature has been given to her.She is tolerating it well, no side effects. No evidence of recurrence   PLAN:   #1 continue arimidex daily tolerating well  #2 she will be seen back in 6 months time for followup.  #3 she knows to call with any problems questions or concerns.   All questions were answered. The patient knows to call the clinic with any problems, questions or concerns. We  can certainly see the patient much sooner if necessary.  I spent >10 minutes counseling the patient face to face. The total time spent in the appointment  was 30 minutes.    Drue Second, MD Medical/Oncology [facility] [phone] (beeper) [phone] (Office)  01/12/2013, 1:10 PM

## 2013-01-12 NOTE — Telephone Encounter (Signed)
gv pt appt schedule September.  °

## 2013-03-09 ENCOUNTER — Encounter (INDEPENDENT_AMBULATORY_CARE_PROVIDER_SITE_OTHER): Payer: Self-pay | Admitting: Surgery

## 2013-03-09 ENCOUNTER — Ambulatory Visit (INDEPENDENT_AMBULATORY_CARE_PROVIDER_SITE_OTHER): Payer: BC Managed Care – HMO | Admitting: Surgery

## 2013-03-09 VITALS — BP 148/74 | HR 76 | Temp 97.4°F | Resp 18 | Ht 63.5 in | Wt 289.4 lb

## 2013-03-09 DIAGNOSIS — Z853 Personal history of malignant neoplasm of breast: Secondary | ICD-10-CM

## 2013-03-09 NOTE — Patient Instructions (Signed)
We will see you again on an as needed basis. Please call the office at [phone] if you have any questions or concerns. Thank you for allowing us to take care of you.  

## 2013-03-09 NOTE — Progress Notes (Signed)
NAME: [name]       DOB: [birth year]           DATE: 03/09/2013       MRN: [id number]   [name] is a 77 y.o.Marland Kitchenfemale who presents for routine followup of her Stage I, receptor +, Her 2 neg,Right breast cancer, IDC,diagnosed in Feb, 2012 and treated with lumpectomy and anti-estrogen, no radiation. She has no problems or concerns on either side.  PFSH: She has had no significant changes since the last visit here.  ROS: There have been no significant changes since the last visit here  EXAM:  VS: BP 148/74  Pulse 76  Temp(Src) 97.4 F (36.3 C) (Oral)  Resp 18  Ht 5' 3.5" (1.613 m)  Wt 289 lb 6.4 oz (131.271 kg)  BMI 50.45 kg/m2   General: The patient is alert, oriented, generally healthy appearing, NAD. Mood and affect are normal.  Breasts:  Symmetric, large, pendulous, no mass, lumpectomy iste well healed and no problems  Lymphatics: She has no axillary or supraclavicular adenopathy on either side.  Extremities: Full ROM of the surgical side with no lymphedema noted.  Data Reviewed: Mammogram, Feb 2014 IMPRESSION:  No mammographic evidence of malignancy.  RECOMMENDATION:  Yearly diagnostic mammography is suggested.  I have discussed the findings and recommendations with the patient.  Results were also provided in writing at the conclusion of the  visit.  BI-RADS CATEGORY 2: Benign finding(s).  Original Report Authenticated By: Cain Saupe, M.D.   Impression: Doing well, with no evidence of recurrent cancer or new cancer  Plan: She is going to do the remainder of her follow up with Dr Welton Flakes and her primary care physician. Will therefore see PRN

## 2013-03-14 ENCOUNTER — Other Ambulatory Visit: Payer: Self-pay | Admitting: Nurse Practitioner

## 2013-03-14 DIAGNOSIS — M25551 Pain in right hip: Secondary | ICD-10-CM

## 2013-03-21 ENCOUNTER — Ambulatory Visit
Admission: RE | Admit: 2013-03-21 | Discharge: 2013-03-21 | Disposition: A | Discharge status: Home / Self Care | Payer: Medicare Other | Source: Ambulatory Visit | Attending: Nurse Practitioner | Admitting: Nurse Practitioner

## 2013-03-21 DIAGNOSIS — M25551 Pain in right hip: Secondary | ICD-10-CM

## 2013-06-20 ENCOUNTER — Other Ambulatory Visit: Payer: Self-pay

## 2013-07-26 ENCOUNTER — Other Ambulatory Visit (HOSPITAL_BASED_OUTPATIENT_CLINIC_OR_DEPARTMENT_OTHER): Payer: Medicare Other | Admitting: Lab

## 2013-07-26 ENCOUNTER — Telehealth: Payer: Self-pay | Admitting: *Deleted

## 2013-07-26 ENCOUNTER — Ambulatory Visit (HOSPITAL_BASED_OUTPATIENT_CLINIC_OR_DEPARTMENT_OTHER): Payer: Medicare Other | Admitting: Oncology

## 2013-07-26 ENCOUNTER — Encounter: Payer: Self-pay | Admitting: Oncology

## 2013-07-26 VITALS — BP 165/78 | HR 78 | Temp 98.3°F | Resp 20 | Ht 63.5 in | Wt 281.9 lb

## 2013-07-26 DIAGNOSIS — C50419 Malignant neoplasm of upper-outer quadrant of unspecified female breast: Secondary | ICD-10-CM

## 2013-07-26 LAB — CBC WITH DIFFERENTIAL/PLATELET
Basophils Absolute: 0 10*3/uL (ref 0.0–0.1)
Eosinophils Absolute: 0.1 10*3/uL (ref 0.0–0.5)
HCT: 36.8 % (ref 34.8–46.6)
HGB: 12.2 g/dL (ref 11.6–15.9)
MCV: 80.4 fL (ref 79.5–101.0)
MONO%: 7.7 % (ref 0.0–14.0)
NEUT#: 6.1 10*3/uL (ref 1.5–6.5)
NEUT%: 73.7 % (ref 38.4–76.8)
RDW: 16 % — ABNORMAL HIGH (ref 11.2–14.5)

## 2013-07-26 LAB — COMPREHENSIVE METABOLIC PANEL (CC13)
Albumin: 3.6 g/dL (ref 3.5–5.0)
Alkaline Phosphatase: 47 U/L (ref 40–150)
BUN: 26.5 mg/dL — ABNORMAL HIGH (ref 7.0–26.0)
Calcium: 9.7 mg/dL (ref 8.4–10.4)
Chloride: 109 mEq/L (ref 98–109)
Glucose: 133 mg/dl (ref 70–140)
Potassium: 4.8 mEq/L (ref 3.5–5.1)

## 2013-07-26 NOTE — Patient Instructions (Addendum)
Doing well  Continue arimidex 1 mg daily  We will see you back in 6 months

## 2013-07-30 NOTE — Telephone Encounter (Signed)
sw pt gv appt for 01/24/14 with labs@ 12 noon and ov@ 12:30pm. Pt is aware that i will mail a letter/avs....td

## 2013-08-05 NOTE — Progress Notes (Signed)
OFFICE PROGRESS NOTE  CC  [name],BYRON, [name] [address] [facility] [place] [address] Dr. Cyndia Bent Dr. Chipper Herb  DIAGNOSIS: 77 year old female with stage IA invasive ductal carcinoma that is ER positive. Patient is status post lumpectomy with sentinel node biopsy.  PRIOR THERAPY:  #1 patient was seen in the multidisciplinary breast clinic for a T1 NX MX invasive ductal carcinoma that was ER positive HER-2/neu negative.  #2 patient has gone on to have a Right breast lumpectomy and sentinel node on 02/15/2012. Her final pathology revealed a 1.4 cm invasive cancer one sentinel node was negative for metastatic disease tumor was ER positive.  #3 Oncotype DX was sent her with breast cancer recurrence score is 13. This gives her a 9% average rate of distant recurrence at 5 years with use of a hormonal agent.  #4 patient's case was discussed at the multi-disciplinary breast conference this week. Radiation therapy was not recommended by Dr. Dayton Scrape. Therefore she is on  adjuvant antiestrogen therapy consisting of Arimidex 1 mg daily. A total of 5 years therapy is planned.  CURRENT THERAPY: Arimidex 1 mg daily 03/03/2012.  INTERVAL HISTORY: [name] 77 y.o. female returns for Followup visit today. She is tolerating the arimidex well. No side effects are reported, no nausea or vomiting, no aches or pains no arthragias, no fevers or chills, no vaginal discharge. Remainder of the 10 point review of systems is negative.  MEDICAL HISTORY: Past Medical History  Diagnosis Date  . Hypertension   . Fibromyalgia   . PONV (postoperative nausea and vomiting)   . Recurrent upper respiratory infection (URI)     have a cold which is ending  . Arthritis   . Sleep apnea     uses CPAP at night  . High cholesterol   . Restless leg syndrome   . Shortness of breath on exertion   . Type II diabetes mellitus   . H/O hiatal hernia   . Chronic lower back pain      ALLERGIES:  has No Known Allergies.  MEDICATIONS:  Current Outpatient Prescriptions  Medication Sig Dispense Refill  . anastrozole (ARIMIDEX) 1 MG tablet Take 1 tablet (1 mg total) by mouth daily.  90 tablet  12  . aspirin EC 81 MG tablet Take 81 mg by mouth every evening.      . calcium carbonate (OS-CAL) 600 MG TABS Take 600 mg by mouth 2 (two) times daily with a meal.      . celecoxib (CELEBREX) 200 MG capsule Take 200 mg by mouth daily.      . Exenatide (BYDUREON [place]) Inject into the skin once a week.      Marland Kitchen Fexofenadine HCl (ALLEGRA PO) Take by mouth.      . fish oil-omega-3 fatty acids 1000 MG capsule Take 2 g by mouth daily.      . furosemide (LASIX) 20 MG tablet Take 20 mg by mouth daily.       Marland Kitchen HYDROcodone-acetaminophen (VICODIN) 5-500 MG per tablet Take 1 tablet by mouth every 6 (six) hours as needed. For pain      . KLOR-CON M20 20 MEQ tablet daily.      Marland Kitchen lisinopril (PRINIVIL,ZESTRIL) 20 MG tablet Take 60 mg by mouth daily.      . metFORMIN (GLUCOPHAGE) 1000 MG tablet Take 1,000 mg by mouth 2 (two) times daily with a meal.      . metoprolol tartrate (LOPRESSOR) 25 MG tablet Take  25 mg by mouth 2 (two) times daily.      Marland Kitchen rOPINIRole (REQUIP) 2 MG tablet Take 2-6 mg by mouth 4 (four) times daily -  before meals and at bedtime. 1 tablet with meals and 3 tablets at bedtime      . simvastatin (ZOCOR) 40 MG tablet Take 40 mg by mouth every evening.      . solifenacin (VESICARE) 10 MG tablet Take 10 mg by mouth daily.      Marland Kitchen zolpidem (AMBIEN) 5 MG tablet Take 5 mg by mouth at bedtime as needed for sleep.      Marland Kitchen amoxicillin (AMOXIL) 500 MG capsule as needed. Before the dentist      . nystatin (MYCOSTATIN) powder Apply 1 g topically 3 (three) times daily as needed. For fungus       No current facility-administered medications for this visit.    SURGICAL HISTORY:  Past Surgical History  Procedure Laterality Date  . Mastectomy partial / lumpectomy w/ axillary lymphadenectomy   02/15/12    right  . Tonsillectomy and adenoidectomy  1954  . Appendectomy  1954  . Cholecystectomy  circa 1978    Open - done in [place]  . Vaginal hysterectomy  ~ 1974  . Rotator cuff repair  ~ 2011; ~ 2008    right; left  . Joint replacement    . Replacement total knee bilateral  12/2002; 01/2010    left; right  . Shoulder open rotator cuff repair  10/2007    redo  . Breast cyst excision  1950's    right  . Cataract extraction w/ intraocular lens  implant, bilateral  2012  . Carpal tunnel release  2000's    bilaterally  . Breast lumpectomy      right    REVIEW OF SYSTEMS:  Pertinent items are noted in HPI.   PHYSICAL EXAMINATION: General appearance: alert, cooperative and appears stated age Neck: no adenopathy, no carotid bruit, no JVD, supple, symmetrical, trachea midline and thyroid not enlarged, symmetric, no tenderness/mass/nodules Lymph nodes: Cervical, supraclavicular, and axillary nodes normal. Resp: clear to auscultation bilaterally and normal percussion bilaterally Back: symmetric, no curvature. ROM normal. No CVA tenderness. Cardio: regular rate and rhythm and S1, S2 normal GI: soft, non-tender; bowel sounds normal; no masses,  no organomegaly Extremities: extremities normal, atraumatic, no cyanosis or edema Neurologic: Grossly normal Bilateral breast exam:well healed incisional scar on right no local recurrence, left breast no masses ECOG PERFORMANCE STATUS: 1 - Symptomatic but completely ambulatory  Blood pressure 165/78, pulse 78, temperature 98.3 F (36.8 C), temperature source Oral, resp. rate 20, height 5' 3.5" (1.613 m), weight 281 lb 14.4 oz (127.869 kg).  LABORATORY DATA: Lab Results  Component Value Date   WBC 8.3 07/26/2013   HGB 12.2 07/26/2013   HCT 36.8 07/26/2013   MCV 80.4 07/26/2013   PLT 204 07/26/2013      Chemistry      Component Value Date/Time   NA 143 07/26/2013 1059   NA 140 02/04/2012 0956   K 4.8 07/26/2013 1059   K 4.6 02/04/2012 0956    CL 106 01/12/2013 1117   CL 102 02/04/2012 0956   CO2 24 07/26/2013 1059   CO2 28 02/04/2012 0956   BUN 26.5* 07/26/2013 1059   BUN 20 02/04/2012 0956   CREATININE 1.2* 07/26/2013 1059   CREATININE 1.10 02/04/2012 0956      Component Value Date/Time   CALCIUM 9.7 07/26/2013 1059   CALCIUM 9.7 02/04/2012 0956  ALKPHOS 47 07/26/2013 1059   ALKPHOS 52 01/19/2012 0808   AST 23 07/26/2013 1059   AST 15 01/19/2012 0808   ALT 28 07/26/2013 1059   ALT 15 01/19/2012 0808   BILITOT 0.51 07/26/2013 1059   BILITOT 0.4 01/19/2012 0808       RADIOGRAPHIC STUDIES:  Dg Chest 2 View  02/04/2012  *RADIOLOGY REPORT*  Clinical Data: Preadmission  CHEST - 2 VIEW  Comparison: Report 11/26/2003 no images available  Findings: Cardiomediastinal silhouette is unremarkable.  No acute infiltrate or pleural effusion.  No pulmonary edema.  Mild degenerative changes thoracic spine.  IMPRESSION: No active disease.  Original Report Authenticated By: Natasha Mead, M.D.   Mm Breast Surgical Specimen  02/15/2012  *RADIOLOGY REPORT*  Clinical Data:  Right breast carcinoma  RIGHT BREAST NEEDLE LOCALIZATION WITH MAMMOGRAPHIC GUIDANCE AND SPECIMEN RADIOGRAPH  Patient presents for needle localization prior to surgical excision.  The patient and I discussed the procedure of needle localization including benefits and alternatives.  We discussed the high likelihood of a successful procedure.  We discussed the risks of the procedure, including infection, bleeding, tissue injury and further surgery.  Informed written consent was given.  Using mammographic guidance, sterile technique, 2% lidocaine and a 5 cm modified Kopans needle, the mass in the right upper outer quadrant posteriorly was localized using a lateromedial approach. Films were labeled and sent with the patient to surgery.  She tolerated the procedure well.  Specimen radiograph was performed at [facility] operating room and confirms the mass, clip and wire to be present in the  tissue sample.  The specimen is marked for pathology.  IMPRESSION: Needle localization right breast.  No apparent complications.  Original Report Authenticated By: Daryl Eastern, M.D.   Mm Breast Wire Localization Right  02/15/2012  *RADIOLOGY REPORT*  Clinical Data:  Right breast carcinoma  RIGHT BREAST NEEDLE LOCALIZATION WITH MAMMOGRAPHIC GUIDANCE AND SPECIMEN RADIOGRAPH  Patient presents for needle localization prior to surgical excision.  The patient and I discussed the procedure of needle localization including benefits and alternatives.  We discussed the high likelihood of a successful procedure.  We discussed the risks of the procedure, including infection, bleeding, tissue injury and further surgery.  Informed written consent was given.  Using mammographic guidance, sterile technique, 2% lidocaine and a 5 cm modified Kopans needle, the mass in the right upper outer quadrant posteriorly was localized using a lateromedial approach. Films were labeled and sent with the patient to surgery.  She tolerated the procedure well.  Specimen radiograph was performed at [facility] operating room and confirms the mass, clip and wire to be present in the tissue sample.  The specimen is marked for pathology.  IMPRESSION: Needle localization right breast.  No apparent complications.  Original Report Authenticated By: Daryl Eastern, M.D.    ASSESSMENT: 77 year old female with  #1 stage IA invasive ductal carcinoma of the right breast status post lumpectomy with sentinel node biopsy on 02/15/2012. The final pathology revealed a 1.4 cm invasive cancer that was ER positive HER-2/neu negative. Oncotype DX showed a score of only 13 during her in the low risk category. Patient is therefore recommended antiestrogen therapy only. Risks and benefits of Arimidex were discussed with her literature has been given to her.She is tolerating it well, no side effects. No evidence of recurrence   PLAN:   #1  continue arimidex daily tolerating well  #2 she will be seen back in 6 months time for followup.  #  3 she knows to call with any problems questions or concerns.   All questions were answered. The patient knows to call the clinic with any problems, questions or concerns. We can certainly see the patient much sooner if necessary.  I spent >10 minutes counseling the patient face to face. The total time spent in the appointment was 30 minutes.    Drue Second, [name] Medical/Oncology [facility] [phone] (beeper) [phone] (Office)

## 2013-09-20 ENCOUNTER — Other Ambulatory Visit: Payer: Self-pay

## 2013-11-16 ENCOUNTER — Telehealth: Payer: Self-pay | Admitting: Oncology

## 2013-12-24 ENCOUNTER — Other Ambulatory Visit: Payer: Self-pay | Admitting: *Deleted

## 2013-12-24 DIAGNOSIS — C50419 Malignant neoplasm of upper-outer quadrant of unspecified female breast: Secondary | ICD-10-CM

## 2013-12-24 MED ORDER — ANASTROZOLE 1 MG PO TABS: 1.0000 mg | ORAL_TABLET | Freq: Every day | ORAL | Status: DC

## 2013-12-24 NOTE — Telephone Encounter (Signed)
Patient called and requested a refill on Arimidex. Refilled prescription (e-scribe) until patient can see Dr. Humphrey Rolls in march.

## 2014-01-24 ENCOUNTER — Ambulatory Visit: Payer: Medicare Other | Admitting: Oncology

## 2014-01-24 ENCOUNTER — Other Ambulatory Visit: Payer: Self-pay | Admitting: Oncology

## 2014-01-24 ENCOUNTER — Other Ambulatory Visit: Payer: Medicare Other

## 2014-01-24 DIAGNOSIS — Z853 Personal history of malignant neoplasm of breast: Secondary | ICD-10-CM

## 2014-01-24 DIAGNOSIS — Z9889 Other specified postprocedural states: Secondary | ICD-10-CM

## 2014-02-01 ENCOUNTER — Ambulatory Visit (HOSPITAL_BASED_OUTPATIENT_CLINIC_OR_DEPARTMENT_OTHER): Payer: Medicare Other | Admitting: Hematology and Oncology

## 2014-02-01 ENCOUNTER — Other Ambulatory Visit (HOSPITAL_BASED_OUTPATIENT_CLINIC_OR_DEPARTMENT_OTHER): Payer: Medicare Other

## 2014-02-01 ENCOUNTER — Encounter: Payer: Self-pay | Admitting: Hematology and Oncology

## 2014-02-01 VITALS — BP 144/73 | HR 80 | Temp 97.8°F | Resp 20 | Ht 63.5 in | Wt 277.8 lb

## 2014-02-01 DIAGNOSIS — C50419 Malignant neoplasm of upper-outer quadrant of unspecified female breast: Secondary | ICD-10-CM

## 2014-02-01 DIAGNOSIS — Z170 Estrogen receptor positive status [ER+]: Secondary | ICD-10-CM

## 2014-02-01 LAB — CBC WITH DIFFERENTIAL/PLATELET
BASO%: 0.5 % (ref 0.0–2.0)
Basophils Absolute: 0 10*3/uL (ref 0.0–0.1)
EOS%: 3.1 % (ref 0.0–7.0)
Eosinophils Absolute: 0.3 10*3/uL (ref 0.0–0.5)
HCT: 36.6 % (ref 34.8–46.6)
HGB: 11.8 g/dL (ref 11.6–15.9)
LYMPH#: 1.6 10*3/uL (ref 0.9–3.3)
LYMPH%: 18.1 % (ref 14.0–49.7)
MCH: 26.5 pg (ref 25.1–34.0)
MCHC: 32.3 g/dL (ref 31.5–36.0)
MCV: 82 fL (ref 79.5–101.0)
MONO#: 0.5 10*3/uL (ref 0.1–0.9)
MONO%: 6.2 % (ref 0.0–14.0)
NEUT#: 6.2 10*3/uL (ref 1.5–6.5)
NEUT%: 72.1 % (ref 38.4–76.8)
Platelets: 255 10*3/uL (ref 145–400)
RBC: 4.46 10*6/uL (ref 3.70–5.45)
RDW: 15.9 % — AB (ref 11.2–14.5)
WBC: 8.7 10*3/uL (ref 3.9–10.3)

## 2014-02-01 LAB — COMPREHENSIVE METABOLIC PANEL (CC13)
ALBUMIN: 3.5 g/dL (ref 3.5–5.0)
ALK PHOS: 57 U/L (ref 40–150)
ALT: 18 U/L (ref 0–55)
AST: 14 U/L (ref 5–34)
Anion Gap: 9 mEq/L (ref 3–11)
BUN: 22.3 mg/dL (ref 7.0–26.0)
CALCIUM: 9.5 mg/dL (ref 8.4–10.4)
CHLORIDE: 107 meq/L (ref 98–109)
CO2: 26 mEq/L (ref 22–29)
Creatinine: 1.1 mg/dL (ref 0.6–1.1)
Glucose: 128 mg/dl (ref 70–140)
POTASSIUM: 4.8 meq/L (ref 3.5–5.1)
SODIUM: 142 meq/L (ref 136–145)
Total Bilirubin: 0.38 mg/dL (ref 0.20–1.20)
Total Protein: 6.3 g/dL — ABNORMAL LOW (ref 6.4–8.3)

## 2014-02-01 NOTE — Progress Notes (Signed)
Marland Kitchen  OFFICE PROGRESS NOTE  CC  HOFFMAN,BYRON, MD [address] [facility] [place] [address] Dr. Neldon Mc Dr. Arloa Koh  DIAGNOSIS: 78 year old female with stage IA invasive ductal carcinoma that is ER positive. Patient is status post lumpectomy with sentinel node biopsy.  PRIOR THERAPY:  #1 patient was seen in the multidisciplinary breast clinic for a T1 NX MX invasive ductal carcinoma that was ER positive HER-2/neu negative.  #2 patient has gone on to have a Right breast lumpectomy and sentinel node on 02/15/2012. Her final pathology revealed a 1.4 cm invasive cancer one sentinel node was negative for metastatic disease tumor was ER positive.  #3 Oncotype DX was sent her with breast cancer recurrence score is 13. This gives her a 9% average rate of distant recurrence at 5 years with use of a hormonal agent.  #4 patient's case was discussed at the multi-disciplinary breast conference this week. Radiation therapy was not recommended by Dr. Valere Dross. Therefore she is on  adjuvant antiestrogen therapy consisting of Arimidex 1 mg daily. A total of 5 years therapy is planned.  CURRENT THERAPY: Arimidex 1 mg daily 03/03/2012.  INTERVAL HISTORY: [name] 78 y.o. female returns for followup visit today. She is tolerating the arimidex well. No side effects are reported, no nausea or vomiting, no fevers or chills, no vaginal discharge. 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 Main problem is her insomnia,up to a week at times without any sleep.She has sleep apnea and uses CPAP. She reports muscle cramps both legs. She takes vitamin with 1000 mg Calcium with vit D after her knee surgery.  Remainder of the 10 point review of systems is negative.  MEDICAL HISTORY: Past  Medical History  Diagnosis Date  . Hypertension   . Fibromyalgia   . PONV (postoperative nausea and vomiting)   . Recurrent upper respiratory infection (URI)     have a cold which is ending  . Arthritis   . Sleep apnea     uses CPAP at night  . High cholesterol   . Restless leg syndrome   . Shortness of breath on exertion   . Type II diabetes mellitus   . H/O hiatal hernia   . Chronic lower back pain     ALLERGIES:  has No Known Allergies.  MEDICATIONS:  Current Outpatient Prescriptions  Medication Sig Dispense Refill  . anastrozole (ARIMIDEX) 1 MG tablet Take 1 tablet (1 mg total) by mouth daily.  90 tablet  0  . aspirin EC 81 MG tablet Take 81 mg by mouth every evening.      . calcium carbonate (OS-CAL) 600 MG TABS Take 600 mg by mouth 2 (two) times daily with a meal.      . celecoxib (CELEBREX) 200 MG capsule Take 200 mg by mouth daily.      . fish oil-omega-3 fatty acids 1000 MG capsule Take 2 g by mouth daily.      . furosemide (LASIX) 20 MG tablet Take 20 mg by mouth daily.       Marland Kitchen HYDROcodone-acetaminophen (VICODIN) 5-500 MG per tablet Take 1 tablet by mouth every 6 (six) hours as needed. For pain      . KLOR-CON M20 20 MEQ tablet daily.      Marland Kitchen lisinopril (PRINIVIL,ZESTRIL) 20  MG tablet Take 40 mg by mouth 2 (two) times daily at 10 AM and 5 PM.       . metFORMIN (GLUCOPHAGE) 1000 MG tablet Take 1,000 mg by mouth 2 (two) times daily with a meal.      . metoprolol tartrate (LOPRESSOR) 25 MG tablet Take 25 mg by mouth 2 (two) times daily.      Marland Kitchen nystatin (MYCOSTATIN) powder Apply 1 g topically 3 (three) times daily as needed. For fungus      . rOPINIRole (REQUIP) 2 MG tablet Take 2-6 mg by mouth 4 (four) times daily -  before meals and at bedtime. 1 tablet with meals and 3 tablets at bedtime      . simvastatin (ZOCOR) 40 MG tablet Take 40 mg by mouth every evening.      . solifenacin (VESICARE) 10 MG tablet Take 10 mg by mouth daily.      Marland Kitchen amoxicillin (AMOXIL) 500 MG  capsule as needed. Before the dentist       No current facility-administered medications for this visit.    SURGICAL HISTORY:  Past Surgical History  Procedure Laterality Date  . Mastectomy partial / lumpectomy w/ axillary lymphadenectomy  02/15/12    right  . Tonsillectomy and adenoidectomy  1954  . Appendectomy  1954  . Cholecystectomy  circa 1978    Open - done in [place]  . Vaginal hysterectomy  ~ 1974  . Rotator cuff repair  ~ 2011; ~ 2008    right; left  . Joint replacement    . Replacement total knee bilateral  12/2002; 01/2010    left; right  . Shoulder open rotator cuff repair  10/2007    redo  . Breast cyst excision  1950's    right  . Cataract extraction w/ intraocular lens  implant, bilateral  2012  . Carpal tunnel release  2000's    bilaterally  . Breast lumpectomy      right    REVIEW OF SYSTEMS:  Pertinent items are noted in HPI.   PHYSICAL EXAMINATION: General appearance: alert, cooperative and appears stated age Neck: no adenopathy, no carotid bruit, no JVD, supple, symmetrical, trachea midline and thyroid not enlarged, symmetric, no tenderness/mass/nodules Lymph nodes: Cervical, supraclavicular, and axillary nodes normal. Resp: clear to auscultation bilaterally and normal percussion bilaterally Back: symmetric, no curvature. ROM normal. No CVA tenderness. Cardio: regular rate and rhythm and S1, S2 normal GI: soft, non-tender; bowel sounds normal; no masses,  no organomegaly Extremities: extremities normal, atraumatic, no cyanosis or edema Neurologic: Grossly normal Bilateral breast exam:well healed incisional scar on right no local recurrence, left breast no masses.both breasts are very large and difficult to examine. ECOG PERFORMANCE STATUS: 1 - Symptomatic but completely ambulatory  Blood pressure 144/73, pulse 80, temperature 97.8 F (36.6 C), temperature source Oral, resp. rate 20, height 5' 3.5" (1.613 m), weight 277 lb 12.8 oz (126.009  kg).  LABORATORY DATA: Lab Results  Component Value Date   WBC 8.7 02/01/2014   HGB 11.8 02/01/2014   HCT 36.6 02/01/2014   MCV 82.0 02/01/2014   PLT 255 02/01/2014      Chemistry      Component Value Date/Time   NA 142 02/01/2014 1510   NA 140 02/04/2012 0956   K 4.8 02/01/2014 1510   K 4.6 02/04/2012 0956   CL 106 01/12/2013 1117   CL 102 02/04/2012 0956   CO2 26 02/01/2014 1510   CO2 28 02/04/2012 0956   BUN 22.3  02/01/2014 1510   BUN 20 02/04/2012 0956   CREATININE 1.1 02/01/2014 1510   CREATININE 1.10 02/04/2012 0956      Component Value Date/Time   CALCIUM 9.5 02/01/2014 1510   CALCIUM 9.7 02/04/2012 0956   ALKPHOS 57 02/01/2014 1510   ALKPHOS 52 01/19/2012 0808   AST 14 02/01/2014 1510   AST 15 01/19/2012 0808   ALT 18 02/01/2014 1510   ALT 15 01/19/2012 0808   BILITOT 0.38 02/01/2014 1510   BILITOT 0.4 01/19/2012 0808       RADIOGRAPHIC STUDIES:  Dg Chest 2 View  02/04/2012  *RADIOLOGY REPORT*  Clinical Data: Preadmission  CHEST - 2 VIEW  Comparison: Report 11/26/2003 no images available  Findings: Cardiomediastinal silhouette is unremarkable.  No acute infiltrate or pleural effusion.  No pulmonary edema.  Mild degenerative changes thoracic spine.  IMPRESSION: No active disease.  Original Report Authenticated By: Lahoma Crocker, M.D.   Mm Breast Surgical Specimen  02/15/2012  *RADIOLOGY REPORT*  Clinical Data:  Right breast carcinoma  RIGHT BREAST NEEDLE LOCALIZATION WITH MAMMOGRAPHIC GUIDANCE AND SPECIMEN RADIOGRAPH  Patient presents for needle localization prior to surgical excision.  The patient and I discussed the procedure of needle localization including benefits and alternatives.  We discussed the high likelihood of a successful procedure.  We discussed the risks of the procedure, including infection, bleeding, tissue injury and further surgery.  Informed written consent was given.  Using mammographic guidance, sterile technique, 2% lidocaine and a 5 cm modified Kopans needle, the mass in  the right upper outer quadrant posteriorly was localized using a lateromedial approach. Films were labeled and sent with the patient to surgery.  She tolerated the procedure well.  Specimen radiograph was performed at [facility] operating room and confirms the mass, clip and wire to be present in the tissue sample.  The specimen is marked for pathology.  IMPRESSION: Needle localization right breast.  No apparent complications.  Original Report Authenticated By: Chaney Born, M.D.   Mm Breast Wire Localization Right  02/15/2012  *RADIOLOGY REPORT*  Clinical Data:  Right breast carcinoma  RIGHT BREAST NEEDLE LOCALIZATION WITH MAMMOGRAPHIC GUIDANCE AND SPECIMEN RADIOGRAPH  Patient presents for needle localization prior to surgical excision.  The patient and I discussed the procedure of needle localization including benefits and alternatives.  We discussed the high likelihood of a successful procedure.  We discussed the risks of the procedure, including infection, bleeding, tissue injury and further surgery.  Informed written consent was given.  Using mammographic guidance, sterile technique, 2% lidocaine and a 5 cm modified Kopans needle, the mass in the right upper outer quadrant posteriorly was localized using a lateromedial approach. Films were labeled and sent with the patient to surgery.  She tolerated the procedure well.  Specimen radiograph was performed at [facility] operating room and confirms the mass, clip and wire to be present in the tissue sample.  The specimen is marked for pathology.  IMPRESSION: Needle localization right breast.  No apparent complications.  Original Report Authenticated By: Chaney Born, M.D.    ASSESSMENT: 78 year old female with  #1 stage IA invasive ductal carcinoma of the right breast status post lumpectomy with sentinel node biopsy on 02/15/2012. The final pathology revealed a 1.4 cm invasive cancer that was ER positive HER-2/neu negative.  Oncotype DX showed a score of only 13 during her in the low risk category. Patient was therefore recommended antiestrogen therapy only. Risks and benefits of Arimidex were discussed with her  literature has been given to her. She is tolerating it well, no side effects. No evidence of recurrence   PLAN:   #1 continue Arimidex daily tolerating well.Diffuse bone aching,no change  Due for mammogram  arranged through her Primary MD on 02/05/2014.  She states Bone density normal 2 years ago(no report available) continue on Calcium 600 mg  2 tabl daily and vit D 2000 IU one tabl daily. Will call Primary MD and request Bone Density report and Vit D report.  Muscle cramps take Mgoxide 400 mg one tabl daily   Follow up in 6 months with CBC and CMP.     she knows to call with any problems questions or concerns.   All questions were answered. The patient knows to call the clinic with any problems, questions or concerns. We can certainly see the patient much sooner if necessary.  I spent  15  minutes counseling the patient face to face. The total time spent in the appointment was 30 minutes.   Amada Kingfisher, M.D. Oncology/Hematology [place] [phone] (Office)   02/01/2014

## 2014-02-04 ENCOUNTER — Telehealth: Payer: Self-pay | Admitting: Oncology

## 2014-02-04 NOTE — Telephone Encounter (Signed)
, °

## 2014-02-05 ENCOUNTER — Other Ambulatory Visit: Payer: Self-pay

## 2014-02-05 DIAGNOSIS — C50419 Malignant neoplasm of upper-outer quadrant of unspecified female breast: Secondary | ICD-10-CM

## 2014-02-07 ENCOUNTER — Ambulatory Visit
Admission: RE | Admit: 2014-02-07 | Discharge: 2014-02-07 | Disposition: A | Discharge status: Home / Self Care | Payer: Medicare Other | Source: Ambulatory Visit | Attending: Oncology | Admitting: Oncology

## 2014-02-07 DIAGNOSIS — Z853 Personal history of malignant neoplasm of breast: Secondary | ICD-10-CM

## 2014-02-07 DIAGNOSIS — Z9889 Other specified postprocedural states: Secondary | ICD-10-CM

## 2014-03-14 ENCOUNTER — Ambulatory Visit (INDEPENDENT_AMBULATORY_CARE_PROVIDER_SITE_OTHER): Payer: Medicare Other | Admitting: Podiatry

## 2014-03-14 ENCOUNTER — Ambulatory Visit (INDEPENDENT_AMBULATORY_CARE_PROVIDER_SITE_OTHER): Payer: Medicare Other

## 2014-03-14 ENCOUNTER — Encounter: Payer: Self-pay | Admitting: Podiatry

## 2014-03-14 VITALS — BP 152/77 | HR 70 | Resp 16 | Ht 62.5 in | Wt 272.0 lb

## 2014-03-14 DIAGNOSIS — M2040 Other hammer toe(s) (acquired), unspecified foot: Secondary | ICD-10-CM

## 2014-03-14 DIAGNOSIS — L84 Corns and callosities: Secondary | ICD-10-CM

## 2014-03-14 NOTE — Progress Notes (Signed)
   Subjective:    Patient ID: [name], female    DOB: 01-24-1936, 78 y.o.   MRN: [id number]  HPI Comments: i have hammertoes and they are starting to rub a place on my toes. i am a type II DIABETIC with last a1c 6.1 . They are hurting all the time even if the dog rubs on them      Review of Systems  Constitutional:       Weight changes   Eyes: Positive for itching.  Endocrine:       Diabetic   Musculoskeletal:       Joint pain  Back pain  Difficulty walking  Muscle pain   All other systems reviewed and are negative.      Objective:   Physical Exam        Assessment & Plan:

## 2014-03-15 NOTE — Progress Notes (Signed)
Subjective:     Patient ID: [name], female   DOB: [birth year], 78 y.o.   MRN: [id number]  HPI patient presents complaining of digital deformities of both feet lesser digits with pain if shoe wear shoe gear that is too tight. States it's been going on for long time and that she has diabetes under excellent control  Review of Systems  All other systems reviewed and are negative.      Objective:   Physical Exam  Nursing note and vitals reviewed. Constitutional: She is oriented to person, place, and time.  Cardiovascular: Intact distal pulses.   Musculoskeletal: Normal range of motion.  Neurological: She is oriented to person, place, and time.  Skin: Skin is dry.   neurovascular status intact with muscle strength adequate and range of motion of the subtalar and midtarsal joint within normal limits. Patient's digits are found to be well perfused and there is rigid contracture second toe both feet with flexible deformities of the remaining lesser digits    Assessment:     Hammertoe deformity with rigid contracture digit 2 of both feet with mild redness on top the joint second toe left foot    Plan:     H&P performed and x-rays reviewed. Advised this patient on wider shoes and dispensed pads to reduce pressure against the second toe and reviewed digital fusion of the lesser digits which could be accomplished if necessary. She will come back as symptoms indicate

## 2014-03-30 ENCOUNTER — Other Ambulatory Visit: Payer: Self-pay | Admitting: Oncology

## 2014-03-30 DIAGNOSIS — C50419 Malignant neoplasm of upper-outer quadrant of unspecified female breast: Secondary | ICD-10-CM

## 2014-06-05 ENCOUNTER — Ambulatory Visit: Payer: Medicare Other | Admitting: Neurology

## 2014-06-06 ENCOUNTER — Ambulatory Visit (INDEPENDENT_AMBULATORY_CARE_PROVIDER_SITE_OTHER): Payer: Medicare Other | Admitting: Neurology

## 2014-06-06 ENCOUNTER — Encounter: Payer: Self-pay | Admitting: Neurology

## 2014-06-06 VITALS — BP 171/75 | HR 70 | Ht 62.0 in | Wt 274.0 lb

## 2014-06-06 DIAGNOSIS — M545 Low back pain, unspecified: Secondary | ICD-10-CM

## 2014-06-06 DIAGNOSIS — G8929 Other chronic pain: Secondary | ICD-10-CM

## 2014-06-06 DIAGNOSIS — R269 Unspecified abnormalities of gait and mobility: Secondary | ICD-10-CM

## 2014-06-06 DIAGNOSIS — M629 Disorder of muscle, unspecified: Secondary | ICD-10-CM

## 2014-06-06 DIAGNOSIS — E669 Obesity, unspecified: Secondary | ICD-10-CM

## 2014-06-06 DIAGNOSIS — M2420 Disorder of ligament, unspecified site: Secondary | ICD-10-CM

## 2014-06-06 DIAGNOSIS — R29898 Other symptoms and signs involving the musculoskeletal system: Secondary | ICD-10-CM

## 2014-06-06 DIAGNOSIS — G2581 Restless legs syndrome: Secondary | ICD-10-CM | POA: Insufficient documentation

## 2014-06-06 HISTORY — DX: Other symptoms and signs involving the musculoskeletal system: R29.898

## 2014-06-06 HISTORY — DX: Obesity, unspecified: E66.9

## 2014-06-06 HISTORY — DX: Unspecified abnormalities of gait and mobility: R26.9

## 2014-06-06 NOTE — Patient Instructions (Signed)

## 2014-06-06 NOTE — Progress Notes (Signed)
Reason for visit: Left leg weakness  Gaelle C Shidler is a 78 y.o. female  History of present illness:  Ms. Meadors is a 78 year old right-handed white female with a history of diabetes and morbid obesity. The patient does have chronic low back pain, and she has degenerative arthritis affecting the knees, with bilateral total knee replacements. The patient has a gait disorder, and she uses a walker for ambulation. She has been using a walker for least 5 or 6 months, and she will fall on occasion. The last fall was 3 weeks ago. The patient indicates he does have numbness in both feet, left greater than right, with some burning in the feet at night. There has been some issue with weakness of the proximal muscles of the left leg, and she indicates that she has to pull her leg into the car, and she requires a step stool to get into the car. She does have some cramps in the legs, usually in the calf muscle or thigh, but also involving the toes on occasion. She does report some neck discomfort and some right shoulder joint pain. She had sleep apnea, and she is on CPAP at night, but she has chronic insomnia. She does have urgency of the bladder, and she recently has had some diarrhea over the last 6 weeks. She is followed by Dr. Tonita Cong from orthopedic surgery, and she is referred to this office for an evaluation of the left leg weakness.  Past Medical History  Diagnosis Date  . Hypertension   . Fibromyalgia   . PONV (postoperative nausea and vomiting)   . Recurrent upper respiratory infection (URI)     have a cold which is ending  . Arthritis   . Sleep apnea     uses CPAP at night  . High cholesterol   . Restless leg syndrome   . Shortness of breath on exertion   . Type II diabetes mellitus   . H/O hiatal hernia   . Chronic lower back pain   . Weakness of both hips 06/06/2014  . Abnormality of gait 06/06/2014  . Obesity 06/06/2014  . Cancer     breast cancer, lumpectomy    Past Surgical History    Procedure Laterality Date  . Mastectomy partial / lumpectomy w/ axillary lymphadenectomy  02/15/12    right  . Tonsillectomy and adenoidectomy  1954  . Appendectomy  1954  . Cholecystectomy  circa 1978    Open - done in [place]  . Vaginal hysterectomy  ~ 1974  . Rotator cuff repair  ~ 2011; ~ 2008    right; left  . Joint replacement    . Replacement total knee bilateral  12/2002; 01/2010    left; right  . Shoulder open rotator cuff repair  10/2007    redo  . Breast cyst excision  1950's    right  . Cataract extraction w/ intraocular lens  implant, bilateral  2012  . Carpal tunnel release  2000's    bilaterally  . Breast lumpectomy      right    Family History  Problem Relation Age of Onset  . Adopted: Yes  . Anesthesia problems Neg Hx   . Hypotension Neg Hx   . Malignant hyperthermia Neg Hx   . Pseudochol deficiency Neg Hx     Social history:  reports that she quit smoking about 8 years ago. Her smoking use included Cigarettes. She has a 60 pack-year smoking history. She has never used smokeless  tobacco. She reports that she does not drink alcohol or use illicit drugs.  Medications:  Current Outpatient Prescriptions on File Prior to Visit  Medication Sig Dispense Refill  . amLODipine (NORVASC) 5 MG tablet       . amoxicillin (AMOXIL) 500 MG capsule as needed. Before the dentist      . anastrozole (ARIMIDEX) 1 MG tablet TAKE ONE TABLET BY MOUTH DAILY  90 tablet  1  . aspirin EC 81 MG tablet Take 81 mg by mouth every evening.      . calcium carbonate (OS-CAL) 600 MG TABS Take 600 mg by mouth 2 (two) times daily with a meal.      . furosemide (LASIX) 20 MG tablet Take 20 mg by mouth daily.       Marland Kitchen HYDROcodone-acetaminophen (VICODIN) 5-500 MG per tablet Take 1 tablet by mouth every 6 (six) hours as needed. For pain      . KLOR-CON M20 20 MEQ tablet daily.      Marland Kitchen lisinopril (PRINIVIL,ZESTRIL) 20 MG tablet Take 40 mg by mouth 2 (two) times daily at 10 AM and 5 PM.       .  metFORMIN (GLUCOPHAGE) 1000 MG tablet Take 1,000 mg by mouth 2 (two) times daily with a meal.      . metoprolol tartrate (LOPRESSOR) 25 MG tablet Take 25 mg by mouth 2 (two) times daily.      . NEOMYCIN-POLYMYXIN-HYDROCORTISONE (CORTISPORIN) 1 % SOLN otic solution       . nystatin (MYCOSTATIN) powder Apply 1 g topically 3 (three) times daily as needed. For fungus      . rOPINIRole (REQUIP) 2 MG tablet Take 2-6 mg by mouth 4 (four) times daily -  before meals and at bedtime. 1 tablet with meals and 3 tablets at bedtime      . simvastatin (ZOCOR) 40 MG tablet Take 40 mg by mouth every evening.      . solifenacin (VESICARE) 10 MG tablet Take 10 mg by mouth daily.       No current facility-administered medications on file prior to visit.      Allergies  Allergen Reactions  . Alcohol Hives and Rash    Red Blotches with lots of heat    ROS:  Out of a complete 14 system review of symptoms, the patient complains only of the following symptoms, and all other reviewed systems are negative.  Swelling the legs Shortness of breath Diarrhea Incontinence of the bladder Easy bruising Feeling cold Joint pain, joint swelling, muscle cramps, achy muscles Numbness, weakness Insomnia, decreased energy Restless legs  Blood pressure 171/75, pulse 70, height 5\' 2"  (1.575 m), weight 274 lb (124.286 kg).  Physical Exam  General: The patient is alert and cooperative at the time of the examination. The patient is morbidly obese.  Eyes: Pupils are equal, round, and reactive to light. Discs are flat bilaterally.  Neck: The neck is supple, no carotid bruits are noted.  Respiratory: The respiratory examination is clear.  Cardiovascular: The cardiovascular examination reveals a regular rate and rhythm, no obvious murmurs or rubs are noted.  Skin: Extremities are without significant edema.  Neurologic Exam  Mental status: The patient is alert and oriented x 3 at the time of the examination. The  patient has apparent normal recent and remote memory, with an apparently normal attention span and concentration ability.  Cranial nerves: Facial symmetry is present. There is good sensation of the face to pinprick and soft touch bilaterally. The  strength of the facial muscles and the muscles to head turning and shoulder shrug are normal bilaterally. Speech is well enunciated, no aphasia or dysarthria is noted. Extraocular movements are full. Visual fields are full. The tongue is midline, and the patient has symmetric elevation of the soft palate. No obvious hearing deficits are noted.  Motor: The motor testing reveals 5 over 5 strength of all 4 extremities, with exception that there is 4/5 strength proximally in the left leg. Good symmetric motor tone is noted throughout.  Sensory: Sensory testing is intact to pinprick, soft touch, vibration sensation, and position sense on the upper extremities. With the lower extremities, the patient has a stocking pattern pinprick sensory deficit two thirds the way up the legs, with a mild decrease in position sense of the right foot, normal on the left. The patient reports some decrease in vibration sensation in both feet, left greater than right. No evidence of extinction is noted.  Coordination: Cerebellar testing reveals good finger-nose-finger and heel-to-shin bilaterally.  Gait and station: Gait is slightly wide-based, with a limping gait on the left leg. Tandem gait was not attempted. The patient walks with a walker. The patient is able to arise from a seated position with arms crossed. Romberg is negative. No drift is seen.  Reflexes: Deep tendon reflexes are symmetric. The patient appears to have trace reflexes at the ankles bilaterally. Toes are downgoing bilaterally.   MRI hip 03/21/13:  IMPRESSION:  1. Fluid about the hamstring origins, greater on the right,  compatible with tendinosis and strain. No frank tear of the  hamstrings is seen.  2. Mild  bilateral hip degenerative change appears worse on the  right.  3. Findings compatible with trochanteric bursitis on the left.  4. Lower lumbar degenerative disease is incompletely visualized.    Assessment/Plan:  1. Morbid obesity  2. Left leg weakness, gait instability  3. Diabetes  4. Probable diabetic peripheral neuropathy  The patient will be set up for nerve conduction studies of both legs and one arm, and she will have EMG evaluation of the left leg. The patient does have some weakness proximally in the left leg, with much better strength on the right leg. The patient has not had a recent study of the low back. If a lumbosacral radiculopathy appears to be present, MRI of the lumbosacral spine will be done. She will followup for the EMG evaluation.  Jill Alexanders MD 06/06/2014 7:47 PM  [facility] [address] [place], [place] [address]  Phone [phone] Fax [phone]

## 2014-06-17 ENCOUNTER — Ambulatory Visit (INDEPENDENT_AMBULATORY_CARE_PROVIDER_SITE_OTHER): Payer: Medicare Other | Admitting: Neurology

## 2014-06-17 ENCOUNTER — Encounter (INDEPENDENT_AMBULATORY_CARE_PROVIDER_SITE_OTHER): Payer: Self-pay | Admitting: Radiology

## 2014-06-17 DIAGNOSIS — M545 Low back pain, unspecified: Secondary | ICD-10-CM

## 2014-06-17 DIAGNOSIS — R269 Unspecified abnormalities of gait and mobility: Secondary | ICD-10-CM

## 2014-06-17 DIAGNOSIS — M629 Disorder of muscle, unspecified: Secondary | ICD-10-CM

## 2014-06-17 DIAGNOSIS — G8929 Other chronic pain: Secondary | ICD-10-CM

## 2014-06-17 DIAGNOSIS — E1142 Type 2 diabetes mellitus with diabetic polyneuropathy: Secondary | ICD-10-CM

## 2014-06-17 DIAGNOSIS — Z0289 Encounter for other administrative examinations: Secondary | ICD-10-CM

## 2014-06-17 DIAGNOSIS — G2581 Restless legs syndrome: Secondary | ICD-10-CM

## 2014-06-17 DIAGNOSIS — R29898 Other symptoms and signs involving the musculoskeletal system: Secondary | ICD-10-CM

## 2014-06-17 DIAGNOSIS — G544 Lumbosacral root disorders, not elsewhere classified: Secondary | ICD-10-CM

## 2014-06-17 NOTE — Procedures (Signed)
HISTORY:  [name] is a 78 year old patient with a history of diabetes and morbid obesity. She has a history of chronic low back pain. She reports a history of restless leg syndrome and numbness in both feet. The patient reports some proximal weakness of the left leg. She also has right shoulder pain and left hand numbness. She has a history of bilateral carpal tunnel syndrome requiring surgery. She is being evaluated for possible neuropathy or a cervical or a lumbosacral radiculopathy.  NERVE CONDUCTION STUDIES:  Nerve conduction studies were performed on the left upper extremity. The distal motor latency for the left median nerve was prolonged, with a normal motor amplitude. The distal motor latency and motor amplitude for the left ulnar nerve was normal. The F wave latencies and nerve conduction velocities for the left median and ulnar nerves were normal. The sensory latency for the left median nerve was normal, with a borderline normal sensory latency for the left ulnar nerve.  Nerve conduction studies were performed on both lower extremities. The response for the left peroneal nerve was unobtainable. The distal motor latency for the left posterior tibial nerve was normal, with a low motor amplitude. No response was seen following stimulation in the popliteal fossa. The distal motor latencies for the right peroneal and posterior tibial nerves were normal, with low motor amplitudes for these nerves. The F wave latencies for the peroneal and posterior tibial nerves were unobtainable bilaterally. The nerve conduction velocities for the right peroneal and posterior tibial nerves were normal. The sensory latencies for the peroneal nerves were unobtainable on the right, and borderline normal on the left.  EMG STUDIES:  EMG study was performed on the left upper extremity:  The first dorsal interosseous muscle reveals 2 to 4 K units with full recruitment. No fibrillations or positive waves were  noted. The abductor pollicis brevis muscle reveals 2 to 5 K units with decreased recruitment. One plus positive waves were noted. The extensor indicis proprius muscle reveals 1 to 3 K units with full recruitment. No fibrillations or positive waves were noted. The pronator teres muscle reveals 2 to 3 K units with full recruitment. No fibrillations or positive waves were noted. The biceps muscle reveals 1 to 2 K units with full recruitment. No fibrillations or positive waves were noted. The triceps muscle reveals 2 to 4 K units with full recruitment. No fibrillations or positive waves were noted. The anterior deltoid muscle reveals 2 to 3 K units with full recruitment. No fibrillations or positive waves were noted. The cervical paraspinal muscles were tested at 2 levels. No abnormalities of insertional activity were seen at either level tested. There was good relaxation.  EMG study was performed on the left lower extremity:  The tibialis anterior muscle reveals 2 to 6K motor units with moderately decreased recruitment. No fibrillations or positive waves were seen. The peroneus tertius muscle reveals 2 to 6K motor units with moderately decreased recruitment. No fibrillations or positive waves were seen. The medial gastrocnemius muscle reveals up to 6 K motor units with decreased recruitment. No fibrillations or positive waves were seen. The vastus lateralis muscle reveals 2 to 4K motor units with full recruitment. No fibrillations or positive waves were seen. The iliopsoas muscle reveals 2 to 4K motor units with full recruitment. No fibrillations or positive waves were seen. Complex repetitive discharges were seen. The biceps femoris muscle (long head) reveals 2 to 6K motor units with decreased recruitment. No fibrillations or positive waves were  seen. Complex repetitive discharges were seen. The lumbosacral paraspinal muscles were tested at 3 levels, and revealed no abnormalities of insertional  activity at the upper and middle levels tested. One plus positive waves were seen at the lower level. There was good relaxation.   IMPRESSION:  Nerve conduction studies done on the left upper extremity and on both lower extremities shows evidence of a generalized primarily axonal peripheral neuropathy of moderate to severe severity. There is evidence of a borderline left carpal tunnel syndrome. EMG evaluation of the left upper extremity shows findings that could be consistent with carpal tunnel syndrome, without evidence of an overlying cervical radiculopathy. EMG evaluation of the left lower extremity shows primarily chronic distal signs of denervation consistent with a peripheral neuropathy, but there also appears to be some evidence of an overlying primarily chronic L5 radiculopathy. Cannot rule out some involvement of the S1 level as well on this evaluation.  Jill Alexanders MD 06/17/2014 1:57 PM  [facility] [address] [place], [place] [address]  Phone [phone] Fax [phone]

## 2014-08-02 ENCOUNTER — Telehealth: Payer: Self-pay | Admitting: Adult Health

## 2014-08-02 NOTE — Telephone Encounter (Signed)
, °

## 2014-08-08 ENCOUNTER — Other Ambulatory Visit: Payer: Medicare Other

## 2014-08-08 ENCOUNTER — Ambulatory Visit: Payer: Medicare Other | Admitting: Oncology

## 2014-08-15 ENCOUNTER — Ambulatory Visit (HOSPITAL_BASED_OUTPATIENT_CLINIC_OR_DEPARTMENT_OTHER): Payer: Medicare Other | Admitting: Adult Health

## 2014-08-15 ENCOUNTER — Other Ambulatory Visit (HOSPITAL_BASED_OUTPATIENT_CLINIC_OR_DEPARTMENT_OTHER): Payer: Medicare Other

## 2014-08-15 ENCOUNTER — Encounter: Payer: Self-pay | Admitting: Adult Health

## 2014-08-15 VITALS — BP 149/59 | HR 83 | Temp 97.1°F | Resp 19 | Ht 62.0 in | Wt 278.0 lb

## 2014-08-15 DIAGNOSIS — E559 Vitamin D deficiency, unspecified: Secondary | ICD-10-CM

## 2014-08-15 DIAGNOSIS — Z170 Estrogen receptor positive status [ER+]: Secondary | ICD-10-CM

## 2014-08-15 DIAGNOSIS — E2839 Other primary ovarian failure: Secondary | ICD-10-CM

## 2014-08-15 DIAGNOSIS — C50419 Malignant neoplasm of upper-outer quadrant of unspecified female breast: Secondary | ICD-10-CM

## 2014-08-15 DIAGNOSIS — C50411 Malignant neoplasm of upper-outer quadrant of right female breast: Secondary | ICD-10-CM

## 2014-08-15 LAB — COMPREHENSIVE METABOLIC PANEL (CC13)
ALT: 19 U/L (ref 0–55)
AST: 16 U/L (ref 5–34)
Albumin: 3.6 g/dL (ref 3.5–5.0)
Alkaline Phosphatase: 60 U/L (ref 40–150)
Anion Gap: 11 mEq/L (ref 3–11)
BILIRUBIN TOTAL: 0.61 mg/dL (ref 0.20–1.20)
BUN: 18.9 mg/dL (ref 7.0–26.0)
CALCIUM: 10.4 mg/dL (ref 8.4–10.4)
CO2: 25 mEq/L (ref 22–29)
Chloride: 105 mEq/L (ref 98–109)
Creatinine: 1.6 mg/dL — ABNORMAL HIGH (ref 0.6–1.1)
Glucose: 122 mg/dl (ref 70–140)
POTASSIUM: 4.6 meq/L (ref 3.5–5.1)
SODIUM: 141 meq/L (ref 136–145)
Total Protein: 6.8 g/dL (ref 6.4–8.3)

## 2014-08-15 LAB — CBC WITH DIFFERENTIAL/PLATELET
BASO%: 0.7 % (ref 0.0–2.0)
BASOS ABS: 0.1 10*3/uL (ref 0.0–0.1)
EOS ABS: 0.4 10*3/uL (ref 0.0–0.5)
EOS%: 3.6 % (ref 0.0–7.0)
HCT: 35.9 % (ref 34.8–46.6)
HEMOGLOBIN: 11.6 g/dL (ref 11.6–15.9)
LYMPH%: 13.6 % — AB (ref 14.0–49.7)
MCH: 27 pg (ref 25.1–34.0)
MCHC: 32.2 g/dL (ref 31.5–36.0)
MCV: 83.8 fL (ref 79.5–101.0)
MONO#: 0.6 10*3/uL (ref 0.1–0.9)
MONO%: 6.6 % (ref 0.0–14.0)
NEUT%: 75.5 % (ref 38.4–76.8)
NEUTROS ABS: 7.4 10*3/uL — AB (ref 1.5–6.5)
PLATELETS: 259 10*3/uL (ref 145–400)
RBC: 4.29 10*6/uL (ref 3.70–5.45)
RDW: 16.5 % — AB (ref 11.2–14.5)
WBC: 9.8 10*3/uL (ref 3.9–10.3)
lymph#: 1.3 10*3/uL (ref 0.9–3.3)

## 2014-08-15 NOTE — Patient Instructions (Signed)
You are doing well.  You have no sign of recurrence.  Continue Arimidex daily.  I recommend healthy diet, exercise and monthly breast exams.  We will see you back in 6 months.  Please call us if you have any questions or concerns.    Breast Self-Awareness Practicing breast self-awareness may pick up problems early, prevent significant medical complications, and possibly save your life. By practicing breast self-awareness, you can become familiar with how your breasts look and feel and if your breasts are changing. This allows you to notice changes early. It can also offer you some reassurance that your breast health is good. One way to learn what is normal for your breasts and whether your breasts are changing is to do a breast self-exam. If you find a lump or something that was not present in the past, it is best to contact your caregiver right away. Other findings that should be evaluated by your caregiver include nipple discharge, especially if it is bloody; skin changes or reddening; areas where the skin seems to be pulled in (retracted); or new lumps and bumps. Breast pain is seldom associated with cancer (malignancy), but should also be evaluated by a caregiver. HOW TO PERFORM A BREAST SELF-EXAM The best time to examine your breasts is 5-7 days after your menstrual period is over. During menstruation, the breasts are lumpier, and it may be more difficult to pick up changes. If you do not menstruate, have reached menopause, or had your uterus removed (hysterectomy), you should examine your breasts at regular intervals, such as monthly. If you are breastfeeding, examine your breasts after a feeding or after using a breast pump. Breast implants do not decrease the risk for lumps or tumors, so continue to perform breast self-exams as recommended. Talk to your caregiver about how to determine the difference between the implant and breast tissue. Also, talk about the amount of pressure you should use during  the exam. Over time, you will become more familiar with the variations of your breasts and more comfortable with the exam. A breast self-exam requires you to remove all your clothes above the waist. 1. Look at your breasts and nipples. Stand in front of a mirror in a room with good lighting. With your hands on your hips, push your hands firmly downward. Look for a difference in shape, contour, and size from one breast to the other (asymmetry). Asymmetry includes puckers, dips, or bumps. Also, look for skin changes, such as reddened or scaly areas on the breasts. Look for nipple changes, such as discharge, dimpling, repositioning, or redness. 2. Carefully feel your breasts. This is best done either in the shower or tub while using soapy water or when flat on your back. Place the arm (on the side of the breast you are examining) above your head. Use the pads (not the fingertips) of your three middle fingers on your opposite hand to feel your breasts. Start in the underarm area and use  inch (2 cm) overlapping circles to feel your breast. Use 3 different levels of pressure (light, medium, and firm pressure) at each circle before moving to the next circle. The light pressure is needed to feel the tissue closest to the skin. The medium pressure will help to feel breast tissue a little deeper, while the firm pressure is needed to feel the tissue close to the ribs. Continue the overlapping circles, moving downward over the breast until you feel your ribs below your breast. Then, move one finger-width towards the  center of the body. Continue to use the  inch (2 cm) overlapping circles to feel your breast as you move slowly up toward the collar bone (clavicle) near the base of the neck. Continue the up and down exam using all 3 pressures until you reach the middle of the chest. Do this with each breast, carefully feeling for lumps or changes. 3.  Keep a written record with breast changes or normal findings for each  breast. By writing this information down, you do not need to depend only on memory for size, tenderness, or location. Write down where you are in your menstrual cycle, if you are still menstruating. Breast tissue can have some lumps or thick tissue. However, see your caregiver if you find anything that concerns you.  SEEK MEDICAL CARE IF:  You see a change in shape, contour, or size of your breasts or nipples.   You see skin changes, such as reddened or scaly areas on the breasts or nipples.   You have an unusual discharge from your nipples.   You feel a new lump or unusually thick areas.  Document Released: 11/01/2005 Document Revised: 10/18/2012 Document Reviewed: 02/16/2012 [employer] Patient Information 2015 [place], [place]. This information is not intended to replace advice given to you by your health care provider. Make sure you discuss any questions you have with your health care provider.

## 2014-08-15 NOTE — Progress Notes (Signed)
Marland Kitchen  OFFICE PROGRESS NOTE  CC  [name], NP [address] [facility] [place] [address] Dr. Neldon Mc Dr. Arloa Koh  DIAGNOSIS: 78 year old female with stage IA invasive ductal carcinoma that is ER positive. Patient is status post lumpectomy with sentinel node biopsy.  PRIOR THERAPY:  #1 patient was seen in the multidisciplinary breast clinic for a T1 NX MX invasive ductal carcinoma that was ER positive HER-2/neu negative.  #2 patient has gone on to have a Right breast lumpectomy and sentinel node on 02/15/2012. Her final pathology revealed a 1.4 cm invasive cancer one sentinel node was negative for metastatic disease tumor was ER positive.  #3 Oncotype DX was sent her with breast cancer recurrence score is 13. This gives her a 9% average rate of distant recurrence at 5 years with use of a hormonal agent.  #4 patient's case was discussed at the multi-disciplinary breast conference this week. Radiation therapy was not recommended by Dr. Valere Dross. Therefore she is on  adjuvant antiestrogen therapy consisting of Arimidex 1 mg daily. A total of 5 years therapy is planned.  CURRENT THERAPY: Arimidex 1 mg daily starting 03/03/2012.  INTERVAL HISTORY: Ladene C Salehi 78 y.o. female returns for followup visit today. She is taking Arimidex daily.  She says over the last 8 months she has noticed her arthritis is worsening.  She has been seeing her primary care provider for this.  She has been diagnosed with arthritis and degenerative disk disease in her lower spine.  She does have urge incontinence and is going to [facility] for [facility].  She has no control of her bladder, and usually loses her control upon standing.  She does take Vesicare daily which only takes the edge off.  Otherwise, she denies any new pain, fevers, chills, hot flashes, or any further concerns.  She does not notice any difficulty with the Arimidex.    Remainder of the 10 point review  of systems is negative.  MEDICAL HISTORY: Past Medical History  Diagnosis Date  . Hypertension   . Fibromyalgia   . PONV (postoperative nausea and vomiting)   . Recurrent upper respiratory infection (URI)     have a cold which is ending  . Arthritis   . Sleep apnea     uses CPAP at night  . High cholesterol   . Restless leg syndrome   . Shortness of breath on exertion   . Type II diabetes mellitus   . H/O hiatal hernia   . Chronic lower back pain   . Weakness of both hips 06/06/2014  . Abnormality of gait 06/06/2014  . Obesity 06/06/2014  . Cancer     breast cancer, lumpectomy    ALLERGIES:  is allergic to alcohol.  MEDICATIONS:  Current Outpatient Prescriptions  Medication Sig Dispense Refill  . amLODipine (NORVASC) 5 MG tablet       . amoxicillin (AMOXIL) 500 MG capsule as needed. Before the dentist      . anastrozole (ARIMIDEX) 1 MG tablet TAKE ONE TABLET BY MOUTH DAILY  90 tablet  1  . aspirin EC 81 MG tablet Take 81 mg by mouth every evening.      . calcium carbonate (OS-CAL) 600 MG TABS Take 600 mg by mouth 2 (two) times daily with a meal.      . Calcium Carbonate-Vit D-Min (CALCIUM 600+D PLUS MINERALS) 600-400 MG-UNIT TABS Take 1,000 mg by mouth daily.      . Cholecalciferol (D 1000)  1000 UNITS capsule Take 1 capsule by mouth daily.      . diphenhydrAMINE (BENADRYL) 25 mg capsule Take 25 mg by mouth as needed.      . furosemide (LASIX) 20 MG tablet Take 20 mg by mouth daily.       Marland Kitchen HYDROcodone-acetaminophen (VICODIN) 5-500 MG per tablet Take 1 tablet by mouth every 6 (six) hours as needed. For pain      . KLOR-CON M20 20 MEQ tablet daily.      . Liraglutide (VICTOZA) 18 MG/3ML SOPN Inject 0.6 mg into the skin as needed.      Marland Kitchen lisinopril (PRINIVIL,ZESTRIL) 20 MG tablet Take 40 mg by mouth 2 (two) times daily at 10 AM and 5 PM.       . Magnesium 250 MG TABS Take 1,000 mg by mouth daily.      . metFORMIN (GLUCOPHAGE) 1000 MG tablet Take 1,000 mg by mouth 2 (two) times  daily with a meal.      . metoprolol tartrate (LOPRESSOR) 25 MG tablet Take 25 mg by mouth 2 (two) times daily.      . NEOMYCIN-POLYMYXIN-HYDROCORTISONE (CORTISPORIN) 1 % SOLN otic solution       . nystatin (MYCOSTATIN) powder Apply 1 g topically 3 (three) times daily as needed. For fungus      . oxyCODONE-acetaminophen (PERCOCET/ROXICET) 5-325 MG per tablet Take 1 tablet by mouth as needed.      Marland Kitchen rOPINIRole (REQUIP) 2 MG tablet Take 2-6 mg by mouth 4 (four) times daily -  before meals and at bedtime. 1 tablet with meals and 3 tablets at bedtime      . simvastatin (ZOCOR) 40 MG tablet Take 40 mg by mouth every evening.      . solifenacin (VESICARE) 10 MG tablet Take 10 mg by mouth daily.       No current facility-administered medications for this visit.    SURGICAL HISTORY:  Past Surgical History  Procedure Laterality Date  . Mastectomy partial / lumpectomy w/ axillary lymphadenectomy  02/15/12    right  . Tonsillectomy and adenoidectomy  1954  . Appendectomy  1954  . Cholecystectomy  circa 1978    Open - done in [place]  . Vaginal hysterectomy  ~ 1974  . Rotator cuff repair  ~ 2011; ~ 2008    right; left  . Joint replacement    . Replacement total knee bilateral  12/2002; 01/2010    left; right  . Shoulder open rotator cuff repair  10/2007    redo  . Breast cyst excision  1950's    right  . Cataract extraction w/ intraocular lens  implant, bilateral  2012  . Carpal tunnel release  2000's    bilaterally  . Breast lumpectomy      right    REVIEW OF SYSTEMS:  A 10 point review of systems was conducted and is otherwise negative except for what is noted above.    Health Maintenance  Mammogram: 02/07/2014 Colonoscopy:  "a few years back, 10 year f/u recommended" Bone Density Scan: 01/2010 Pap Smear: s/p partial hysterectomy Eye Exam: 02/2014 Vitamin D Level: pending Lipid Panel: 07/2014    PHYSICAL EXAMINATION:  BP 149/59  Pulse 83  Temp(Src) 97.1 F (36.2 C) (Oral)  Resp  19  Ht _0  (1.575 m)  Wt 278 lb (126.1 kg)  BMI 50.83 kg/m2 EXAM LIMITED PATIENT IN CHAIR GENERAL: Patient is a well appearing female in no acute distress HEENT:  Sclerae anicteric.  Oropharynx clear and moist. No ulcerations or evidence of oropharyngeal candidiasis. Neck is supple.  NODES:  No cervical, supraclavicular, or axillary lymphadenopathy palpated.  BREAST EXAM:  S/p right breast lumpectomy, no masses, or nodules, left breast no masses or nodules, benign bilateral breast exam.  LUNGS:  Clear to auscultation bilaterally.  No wheezes or rhonchi. HEART:  Regular rate and rhythm. No murmur appreciated. ABDOMEN:  Soft, nontender.  Positive, normoactive bowel sounds. No organomegaly palpated. MSK:  No focal spinal tenderness to palpation. Full range of motion bilaterally in the upper extremities. EXTREMITIES:  No peripheral edema.   SKIN:  Clear with no obvious rashes or skin changes. No nail dyscrasia. NEURO:  Nonfocal. Well oriented.  Appropriate affect. ECOG PERFORMANCE STATUS: 1 - Symptomatic but completely ambulatory    LABORATORY DATA: Lab Results  Component Value Date   WBC 9.8 08/15/2014   HGB 11.6 08/15/2014   HCT 35.9 08/15/2014   MCV 83.8 08/15/2014   PLT 259 08/15/2014      Chemistry      Component Value Date/Time   NA 142 02/01/2014 1510   NA 140 02/04/2012 0956   K 4.8 02/01/2014 1510   K 4.6 02/04/2012 0956   CL 106 01/12/2013 1117   CL 102 02/04/2012 0956   CO2 26 02/01/2014 1510   CO2 28 02/04/2012 0956   BUN 22.3 02/01/2014 1510   BUN 20 02/04/2012 0956   CREATININE 1.1 02/01/2014 1510   CREATININE 1.10 02/04/2012 0956      Component Value Date/Time   CALCIUM 9.5 02/01/2014 1510   CALCIUM 9.7 02/04/2012 0956   ALKPHOS 57 02/01/2014 1510   ALKPHOS 52 01/19/2012 0808   AST 14 02/01/2014 1510   AST 15 01/19/2012 0808   ALT 18 02/01/2014 1510   ALT 15 01/19/2012 0808   BILITOT 0.38 02/01/2014 1510   BILITOT 0.4 01/19/2012 9702         ASSESSMENT: 78 year old female  with  #1 stage IA invasive ductal carcinoma of the right breast status post lumpectomy with sentinel node biopsy on 02/15/2012. The final pathology revealed a 1.4 cm invasive cancer that was ER positive HER-2/neu negative. Oncotype DX showed a score of only 13 during her in the low risk category. Patient was therefore recommended antiestrogen therapy only and began taking Arimidex daily in April, 2013.     PLAN:   Loucile is doing well today.  She has no sign of recurrence.  Her CBC is stable and I reviewed it with her in detail.  She will continue taking Arimidex daily.  Her CMP and vitamin d level are pending.  We reviewed her health maintenance.  She is due for a repeat bone density.  I ordered this today, however she would prefer to have this done in [facility] and is planning on asking her PCP to order this.  She will have them fax [place] the results.     I recommended healthy diet, exercise, and monthly breast exams.  She will return in 6 months for labs and follow up.    All questions were answered. The patient knows to call the clinic with any problems, questions or concerns. We can certainly see the patient much sooner if necessary.  I spent 25  minutes counseling the patient face to face. The total time spent in the appointment was 30 minutes.   Minette Headland, [place] [phone]

## 2014-08-16 ENCOUNTER — Telehealth: Payer: Self-pay | Admitting: Adult Health

## 2014-08-16 LAB — VITAMIN D 25 HYDROXY (VIT D DEFICIENCY, FRACTURES): Vit D, 25-Hydroxy: 61 ng/mL (ref 30–89)

## 2014-08-16 NOTE — Telephone Encounter (Signed)
s.w. pt and and advised on OCT and April 2016...pt ok adn aware

## 2014-09-10 ENCOUNTER — Telehealth: Payer: Self-pay | Admitting: *Deleted

## 2014-09-10 ENCOUNTER — Ambulatory Visit
Admission: RE | Admit: 2014-09-10 | Discharge: 2014-09-10 | Disposition: A | Discharge status: Home / Self Care | Payer: Medicare Other | Source: Ambulatory Visit | Attending: Adult Health | Admitting: Adult Health

## 2014-09-10 DIAGNOSIS — E2839 Other primary ovarian failure: Secondary | ICD-10-CM

## 2014-09-10 NOTE — Telephone Encounter (Signed)
THERE WILL NOT BE A BONE DENSITY FOR COMPARISON. NOTIFIED LINDSEY CORNETTO,NP. SHE VOICES UNDERSTANDING.

## 2014-09-13 ENCOUNTER — Telehealth: Payer: Self-pay | Admitting: *Deleted

## 2014-09-13 NOTE — Telephone Encounter (Signed)
Message copied by Harmon Pier on Fri Sep 13, 2014  3:47 PM ------      Message from: Minette Headland      Created: Thu Sep 12, 2014  3:36 PM       Please call patient and let her know that her bone density is normal!            [place]       ----- Message -----         From: Rad Results In Interface         Sent: 09/11/2014   8:21 AM           To: Minette Headland, NP                   ------

## 2014-09-13 NOTE — Telephone Encounter (Signed)
Called to inform pt of bone density results. Communicated with pt that bone density is normal and I will fax a copy of these results to her PCP as requested by pt. Message to be forwarded to Charlestine Massed, NP.

## 2014-09-19 ENCOUNTER — Telehealth: Payer: Self-pay

## 2014-09-19 NOTE — Telephone Encounter (Signed)
Bone density results rcvd from the breast center dtd 09/10/14.  Copy to Dr Lindi Adie.  Original to scan.

## 2014-10-02 ENCOUNTER — Encounter: Payer: Self-pay | Admitting: Neurology

## 2014-10-04 ENCOUNTER — Other Ambulatory Visit: Payer: Self-pay | Admitting: Adult Health

## 2014-10-08 ENCOUNTER — Encounter: Payer: Self-pay | Admitting: Neurology

## 2014-10-11 ENCOUNTER — Telehealth: Payer: Self-pay | Admitting: Hematology

## 2015-01-17 ENCOUNTER — Other Ambulatory Visit: Payer: Self-pay | Admitting: Nurse Practitioner

## 2015-01-17 DIAGNOSIS — Z853 Personal history of malignant neoplasm of breast: Secondary | ICD-10-CM

## 2015-02-14 ENCOUNTER — Other Ambulatory Visit: Payer: Self-pay | Admitting: *Deleted

## 2015-02-14 ENCOUNTER — Ambulatory Visit
Admission: RE | Admit: 2015-02-14 | Discharge: 2015-02-14 | Disposition: A | Discharge status: Home / Self Care | Payer: Medicare Other | Source: Ambulatory Visit | Attending: Nurse Practitioner | Admitting: Nurse Practitioner

## 2015-02-14 DIAGNOSIS — C50419 Malignant neoplasm of upper-outer quadrant of unspecified female breast: Secondary | ICD-10-CM

## 2015-02-14 DIAGNOSIS — Z853 Personal history of malignant neoplasm of breast: Secondary | ICD-10-CM

## 2015-02-17 ENCOUNTER — Telehealth: Payer: Self-pay | Admitting: Hematology

## 2015-02-17 ENCOUNTER — Ambulatory Visit: Payer: Medicare Other | Admitting: Hematology

## 2015-02-17 ENCOUNTER — Other Ambulatory Visit: Payer: Medicare Other

## 2015-02-17 ENCOUNTER — Encounter: Payer: Self-pay | Admitting: Hematology

## 2015-02-17 ENCOUNTER — Ambulatory Visit (HOSPITAL_BASED_OUTPATIENT_CLINIC_OR_DEPARTMENT_OTHER): Payer: Medicare Other | Admitting: Hematology

## 2015-02-17 ENCOUNTER — Ambulatory Visit: Payer: Medicare Other | Admitting: Adult Health

## 2015-02-17 ENCOUNTER — Other Ambulatory Visit (HOSPITAL_BASED_OUTPATIENT_CLINIC_OR_DEPARTMENT_OTHER): Payer: Medicare Other

## 2015-02-17 VITALS — BP 157/58 | HR 84 | Temp 99.8°F | Resp 19 | Ht 62.0 in | Wt 289.4 lb

## 2015-02-17 DIAGNOSIS — C50419 Malignant neoplasm of upper-outer quadrant of unspecified female breast: Secondary | ICD-10-CM

## 2015-02-17 DIAGNOSIS — Z171 Estrogen receptor negative status [ER-]: Secondary | ICD-10-CM | POA: Diagnosis not present

## 2015-02-17 DIAGNOSIS — C50411 Malignant neoplasm of upper-outer quadrant of right female breast: Secondary | ICD-10-CM

## 2015-02-17 DIAGNOSIS — D649 Anemia, unspecified: Secondary | ICD-10-CM

## 2015-02-17 LAB — COMPREHENSIVE METABOLIC PANEL (CC13)
ALT: 13 U/L (ref 0–55)
ANION GAP: 12 meq/L — AB (ref 3–11)
AST: 14 U/L (ref 5–34)
Albumin: 3.4 g/dL — ABNORMAL LOW (ref 3.5–5.0)
Alkaline Phosphatase: 67 U/L (ref 40–150)
BILIRUBIN TOTAL: 0.43 mg/dL (ref 0.20–1.20)
BUN: 25.3 mg/dL (ref 7.0–26.0)
CO2: 23 meq/L (ref 22–29)
CREATININE: 1.4 mg/dL — AB (ref 0.6–1.1)
Calcium: 9.3 mg/dL (ref 8.4–10.4)
Chloride: 105 mEq/L (ref 98–109)
EGFR: 37 mL/min/{1.73_m2} — ABNORMAL LOW (ref >90)
Glucose: 185 mg/dl — ABNORMAL HIGH (ref 70–140)
Potassium: 4.7 mEq/L (ref 3.5–5.1)
Sodium: 141 mEq/L (ref 136–145)
Total Protein: 6.5 g/dL (ref 6.4–8.3)

## 2015-02-17 LAB — CBC WITH DIFFERENTIAL/PLATELET
BASO%: 0.5 % (ref 0.0–2.0)
Basophils Absolute: 0 10*3/uL (ref 0.0–0.1)
EOS%: 2.4 % (ref 0.0–7.0)
Eosinophils Absolute: 0.3 10*3/uL (ref 0.0–0.5)
HCT: 35 % (ref 34.8–46.6)
HGB: 11.1 g/dL — ABNORMAL LOW (ref 11.6–15.9)
LYMPH#: 1.3 10*3/uL (ref 0.9–3.3)
LYMPH%: 12.1 % — AB (ref 14.0–49.7)
MCH: 25.6 pg (ref 25.1–34.0)
MCHC: 31.6 g/dL (ref 31.5–36.0)
MCV: 81.2 fL (ref 79.5–101.0)
MONO#: 0.6 10*3/uL (ref 0.1–0.9)
MONO%: 5.8 % (ref 0.0–14.0)
NEUT#: 8.3 10*3/uL — ABNORMAL HIGH (ref 1.5–6.5)
NEUT%: 79.2 % — ABNORMAL HIGH (ref 38.4–76.8)
Platelets: 268 10*3/uL (ref 145–400)
RBC: 4.32 10*6/uL (ref 3.70–5.45)
RDW: 16.6 % — ABNORMAL HIGH (ref 11.2–14.5)
WBC: 10.5 10*3/uL — ABNORMAL HIGH (ref 3.9–10.3)

## 2015-02-17 NOTE — Addendum Note (Signed)
Addended by: Truitt Merle on: 02/17/2015 02:29 PM   Modules accepted: Orders

## 2015-02-17 NOTE — Progress Notes (Signed)
Marland Kitchen  OFFICE PROGRESS NOTE  CC  [name], NP [place] [address] [employer], [place]. [place] [address] Dr. Neldon Mc Dr. Arloa Koh  DIAGNOSIS: 79 year old female with stage IA invasive ductal carcinoma that is ER positive.   PRIOR THERAPY:  #1 patient was seen in the multidisciplinary breast clinic for a T1 NX MX invasive ductal carcinoma that was ER positive HER-2/neu negative.  #2 patient has gone on to have a Right breast lumpectomy and sentinel node on 02/15/2012. Her final pathology revealed a 1.4 cm invasive cancer one sentinel node was negative for metastatic disease tumor was ER positive.  #3 Oncotype DX was sent her with breast cancer recurrence score is 13. This gives her a 9% average rate of distant recurrence at 5 years with use of a hormonal agent.  #4 patient's case was discussed at the multi-disciplinary breast conference this week. Radiation therapy was not recommended by Dr. Valere Dross. Therefore she is on  adjuvant antiestrogen therapy consisting of Arimidex 1 mg daily. A total of 5 years therapy is planned.  CURRENT THERAPY: Arimidex 1 mg daily starting 03/03/2012.  INTERVAL HISTORY: [name] 79 y.o. female returns for followup visit today. She was under Dr. Laurelyn Sickle care before, who has left the practice. She is taking Arimidex daily  and is tolerating the well. She has multiple other medical issues, including multiple joint pain from arthritis, she just had injection at the right shoulder with only. She states her joint pain has not gotten much worse since she started Arimidex. She also has overactive bladder, and awareness diaper when she goes out. She takes Lasix for her leg swollen, she walks around with a walker, had multiple fall at home in the past. She denies any hot flash, or other new symptoms.   Remainder of the 10 point review of systems is negative.  MEDICAL HISTORY: Past Medical History  Diagnosis Date  .  Hypertension   . Fibromyalgia   . PONV (postoperative nausea and vomiting)   . Recurrent upper respiratory infection (URI)     have a cold which is ending  . Arthritis   . Sleep apnea     uses CPAP at night  . High cholesterol   . Restless leg syndrome   . Shortness of breath on exertion   . Type II diabetes mellitus   . H/O hiatal hernia   . Chronic lower back pain   . Weakness of both hips 06/06/2014  . Abnormality of gait 06/06/2014  . Obesity 06/06/2014  . Cancer     breast cancer, lumpectomy    ALLERGIES:  is allergic to alcohol.  MEDICATIONS:  Current Outpatient Prescriptions  Medication Sig Dispense Refill  . amLODipine (NORVASC) 5 MG tablet     . amoxicillin (AMOXIL) 500 MG capsule as needed. Before the dentist    . anastrozole (ARIMIDEX) 1 MG tablet TAKE ONE TABLET BY MOUTH DAILY 90 tablet 1  . aspirin EC 81 MG tablet Take 81 mg by mouth every evening.    . Calcium Carbonate-Vit D-Min (CALCIUM 600+D PLUS MINERALS) 600-400 MG-UNIT TABS Take 1,000 mg by mouth daily.    . Cholecalciferol (D 1000) 1000 UNITS capsule Take 1 capsule by mouth daily.    . cyclobenzaprine (FLEXERIL) 10 MG tablet Take 10 mg by mouth 3 (three) times daily as needed for muscle spasms.    . diphenhydrAMINE (BENADRYL) 25 mg capsule Take 25 mg by mouth as needed.    Marland Kitchen  furosemide (LASIX) 20 MG tablet Take 20 mg by mouth daily.     . HYDROcodone-acetaminophen (VICODIN) 5-500 MG per tablet Take 1 tablet by mouth every 6 (six) hours as needed. For pain    . KLOR-CON M20 20 MEQ tablet daily.    . Liraglutide (VICTOZA) 18 MG/3ML SOPN Inject 0.6 mg into the skin as needed.    . lisinopril (PRINIVIL,ZESTRIL) 20 MG tablet Take 40 mg by mouth 2 (two) times daily at 10 AM and 5 PM.     . Magnesium 250 MG TABS Take 1,000 mg by mouth daily.    . metFORMIN (GLUCOPHAGE) 1000 MG tablet Take 1,000 mg by mouth 2 (two) times daily with a meal.    . metoprolol tartrate (LOPRESSOR) 25 MG tablet Take 25 mg by mouth 2 (two)  times daily.    . NEOMYCIN-POLYMYXIN-HYDROCORTISONE (CORTISPORIN) 1 % SOLN otic solution     . nystatin (MYCOSTATIN) powder Apply 1 g topically 3 (three) times daily as needed. For fungus    . oxyCODONE-acetaminophen (PERCOCET/ROXICET) 5-325 MG per tablet Take 1 tablet by mouth as needed.    . rOPINIRole (REQUIP) 2 MG tablet Take 2-6 mg by mouth 4 (four) times daily -  before meals and at bedtime. 1 tablet with meals and 3 tablets at bedtime    . simvastatin (ZOCOR) 40 MG tablet Take 40 mg by mouth every evening.    . solifenacin (VESICARE) 10 MG tablet Take 10 mg by mouth daily.     No current facility-administered medications for this visit.    SURGICAL HISTORY:  Past Surgical History  Procedure Laterality Date  . Mastectomy partial / lumpectomy w/ axillary lymphadenectomy  02/15/12    right  . Tonsillectomy and adenoidectomy  1954  . Appendectomy  1954  . Cholecystectomy  circa 1978    Open - done in [place]  . Vaginal hysterectomy  ~ 1974  . Rotator cuff repair  ~ 2011; ~ 2008    right; left  . Joint replacement    . Replacement total knee bilateral  12/2002; 01/2010    left; right  . Shoulder open rotator cuff repair  10/2007    redo  . Breast cyst excision  1950's    right  . Cataract extraction w/ intraocular lens  implant, bilateral  2012  . Carpal tunnel release  2000's    bilaterally  . Breast lumpectomy      right    REVIEW OF SYSTEMS:  A 10 point review of systems was conducted and is otherwise negative except for what is noted above.    Health Maintenance  Mammogram: 02/14/2015 Colonoscopy:  "a few years back, 10 year f/u recommended" Bone Density Scan: 09/10/2014 Pap Smear: s/p partial hysterectomy Eye Exam: 02/2014 Vitamin D Level: pending Lipid Panel: 07/2014    PHYSICAL EXAMINATION:  BP 157/58 mmHg  Pulse 84  Temp(Src) 99.8 F (37.7 C) (Oral)  Resp 19  Ht 5' 2" (1.575 m)  Wt 289 lb 6.4 oz (131.271 kg)  BMI 52.92 kg/m2  SpO2 100% EXAM LIMITED  PATIENT IN CHAIR GENERAL: Patient is a well appearing female in no acute distress HEENT:  Sclerae anicteric.  Oropharynx clear and moist. No ulcerations or evidence of oropharyngeal candidiasis. Neck is supple.  NODES:  No cervical, supraclavicular, or axillary lymphadenopathy palpated.  BREAST EXAM:  S/p right breast lumpectomy, no masses, or nodules, left breast no masses or nodules, benign bilateral breast exam.  LUNGS:  Clear to auscultation bilaterally.    No wheezes or rhonchi. HEART:  Regular rate and rhythm. No murmur appreciated. ABDOMEN:  Soft, nontender.  Positive, normoactive bowel sounds. No organomegaly palpated. MSK:  No focal spinal tenderness to palpation. Full range of motion bilaterally in the upper extremities. EXTREMITIES:  No peripheral edema.   SKIN:  Clear with no obvious rashes or skin changes. No nail dyscrasia. NEURO:  Nonfocal. Well oriented.  Appropriate affect. ECOG PERFORMANCE STATUS: 1 - Symptomatic but completely ambulatory    LABORATORY DATA: CBC Latest Ref Rng 02/17/2015 08/15/2014 02/01/2014  WBC 3.9 - 10.3 10e3/uL 10.5(H) 9.8 8.7  Hemoglobin 11.6 - 15.9 g/dL 11.1(L) 11.6 11.8  Hematocrit 34.8 - 46.6 % 35.0 35.9 36.6  Platelets 145 - 400 10e3/uL 268 259 255    CMP Latest Ref Rng 02/17/2015 08/15/2014 02/01/2014  Glucose 70 - 140 mg/dl 185(H) 122 128  BUN 7.0 - 26.0 mg/dL 25.3 18.9 22.3  Creatinine 0.6 - 1.1 mg/dL 1.4(H) 1.6(H) 1.1  Sodium 136 - 145 mEq/L 141 141 142  Potassium 3.5 - 5.1 mEq/L 4.7 4.6 4.8  Chloride 98 - 107 mEq/L - - -  CO2 22 - 29 mEq/L 23 25 26  Calcium 8.4 - 10.4 mg/dL 9.3 10.4 9.5  Total Protein 6.4 - 8.3 g/dL 6.5 6.8 6.3(L)  Total Bilirubin 0.20 - 1.20 mg/dL 0.43 0.61 0.38  Alkaline Phos 40 - 150 U/L 67 60 57  AST 5 - 34 U/L 14 16 14  ALT 0 - 55 U/L 13 19 18   Mammogram on 02/14/2015 IMPRESSION: No evidence of malignancy.  RECOMMENDATION: Bilateral diagnostic mammogram in 1 year.   ASSESSMENT AND PLAN: 79-year-old female  with  1. stage IA invasive ductal carcinoma of the right breast status post lumpectomy with sentinel node biopsy on 02/15/2012. The final pathology revealed a 1.4 cm invasive cancer that was ER positive HER-2/neu negative. Oncotype DX showed a score of only 13 during her in the low risk category. Patient was therefore recommended antiestrogen therapy only and began taking Arimidex daily in April, 2013.   -She is clinically doing well, no evidence of recurrence by mammogram or physical exam. -She will continue Arimidex, for total 5 years -Her bone density scan was normal last year, she'll continue calcium and vitamin D supplement -She continue screening mammogram once a year  2. Normocytic anemia -She has a mild numbness of the anemia. I'll check iron studies and B12 on her next visit. -Her hemoglobin is 11.1 today, no need for blood transfusion or other intervention at this point.   3. Diabetes, osteoarthritis, hypertension, OSA -She will continue follow-up with her primary care physician  4. Morbid obesity -We discussed a healthy diet and regular exercise, to help her lose weight.  Follow-up: Return to clinic in 6 months with lab.  I spent 25  minutes counseling the patient face to face. The total time spent in the appointment was 30 minutes.  Feng, Yan  02/17/2015      

## 2015-02-17 NOTE — Telephone Encounter (Signed)
Gave avs & calendar for October. °

## 2015-02-19 ENCOUNTER — Other Ambulatory Visit: Payer: Self-pay | Admitting: Nurse Practitioner

## 2015-02-19 DIAGNOSIS — M25511 Pain in right shoulder: Secondary | ICD-10-CM

## 2015-03-13 ENCOUNTER — Ambulatory Visit
Admission: RE | Admit: 2015-03-13 | Discharge: 2015-03-13 | Disposition: A | Discharge status: Home / Self Care | Payer: Medicare Other | Source: Ambulatory Visit | Attending: Nurse Practitioner | Admitting: Nurse Practitioner

## 2015-03-13 DIAGNOSIS — M25511 Pain in right shoulder: Secondary | ICD-10-CM

## 2015-03-13 MED ORDER — IOHEXOL 180 MG/ML  SOLN
15.0000 mL | Freq: Once | INTRAMUSCULAR | Status: AC | PRN
  Administered 2015-03-13: 15 mL via INTRA_ARTICULAR

## 2015-06-10 ENCOUNTER — Telehealth: Payer: Self-pay | Admitting: *Deleted

## 2015-06-10 MED ORDER — ANASTROZOLE 1 MG PO TABS: 1.0000 mg | ORAL_TABLET | Freq: Every day | ORAL | Status: DC

## 2015-06-10 NOTE — Telephone Encounter (Signed)
ANASTROZOLE REFILL COMPLETED.

## 2015-08-19 ENCOUNTER — Encounter: Payer: Self-pay | Admitting: Hematology

## 2015-08-19 ENCOUNTER — Other Ambulatory Visit (HOSPITAL_BASED_OUTPATIENT_CLINIC_OR_DEPARTMENT_OTHER): Payer: Medicare Other

## 2015-08-19 ENCOUNTER — Ambulatory Visit (HOSPITAL_BASED_OUTPATIENT_CLINIC_OR_DEPARTMENT_OTHER): Payer: Medicare Other | Admitting: Hematology

## 2015-08-19 ENCOUNTER — Telehealth: Payer: Self-pay | Admitting: Hematology

## 2015-08-19 VITALS — BP 154/70 | HR 101 | Temp 98.3°F | Resp 20 | Ht 62.0 in | Wt 294.0 lb

## 2015-08-19 DIAGNOSIS — C50411 Malignant neoplasm of upper-outer quadrant of right female breast: Secondary | ICD-10-CM

## 2015-08-19 DIAGNOSIS — D649 Anemia, unspecified: Secondary | ICD-10-CM

## 2015-08-19 DIAGNOSIS — E119 Type 2 diabetes mellitus without complications: Secondary | ICD-10-CM | POA: Diagnosis not present

## 2015-08-19 DIAGNOSIS — Z79811 Long term (current) use of aromatase inhibitors: Secondary | ICD-10-CM

## 2015-08-19 DIAGNOSIS — Z170 Estrogen receptor positive status [ER+]: Secondary | ICD-10-CM

## 2015-08-19 LAB — CBC & DIFF AND RETIC
BASO%: 0.3 % (ref 0.0–2.0)
Basophils Absolute: 0 10*3/uL (ref 0.0–0.1)
EOS ABS: 0.3 10*3/uL (ref 0.0–0.5)
EOS%: 2.9 % (ref 0.0–7.0)
HEMATOCRIT: 36 % (ref 34.8–46.6)
HGB: 11.6 g/dL (ref 11.6–15.9)
Immature Retic Fract: 8.6 % (ref 1.60–10.00)
LYMPH#: 1.2 10*3/uL (ref 0.9–3.3)
LYMPH%: 11 % — ABNORMAL LOW (ref 14.0–49.7)
MCH: 27.4 pg (ref 25.1–34.0)
MCHC: 32.2 g/dL (ref 31.5–36.0)
MCV: 85.1 fL (ref 79.5–101.0)
MONO#: 0.8 10*3/uL (ref 0.1–0.9)
MONO%: 6.9 % (ref 0.0–14.0)
NEUT#: 8.8 10*3/uL — ABNORMAL HIGH (ref 1.5–6.5)
NEUT%: 78.9 % — AB (ref 38.4–76.8)
Platelets: 245 10*3/uL (ref 145–400)
RBC: 4.23 10*6/uL (ref 3.70–5.45)
RDW: 15.8 % — AB (ref 11.2–14.5)
RETIC CT ABS: 86.29 10*3/uL (ref 33.70–90.70)
Retic %: 2.04 % (ref 0.70–2.10)
WBC: 11.2 10*3/uL — ABNORMAL HIGH (ref 3.9–10.3)

## 2015-08-19 LAB — COMPREHENSIVE METABOLIC PANEL (CC13)
ALT: 18 U/L (ref 0–55)
AST: 18 U/L (ref 5–34)
Albumin: 3.5 g/dL (ref 3.5–5.0)
Alkaline Phosphatase: 71 U/L (ref 40–150)
Anion Gap: 11 mEq/L (ref 3–11)
BILIRUBIN TOTAL: 0.67 mg/dL (ref 0.20–1.20)
BUN: 23.1 mg/dL (ref 7.0–26.0)
CALCIUM: 10 mg/dL (ref 8.4–10.4)
CHLORIDE: 107 meq/L (ref 98–109)
CO2: 26 mEq/L (ref 22–29)
CREATININE: 1.4 mg/dL — AB (ref 0.6–1.1)
EGFR: 37 mL/min/{1.73_m2} — ABNORMAL LOW (ref >90)
Glucose: 147 mg/dl — ABNORMAL HIGH (ref 70–140)
Potassium: 3.9 mEq/L (ref 3.5–5.1)
Sodium: 144 mEq/L (ref 136–145)
TOTAL PROTEIN: 6.6 g/dL (ref 6.4–8.3)

## 2015-08-19 LAB — IRON AND TIBC CHCC
%SAT: 22 % (ref 21–57)
IRON: 65 ug/dL (ref 41–142)
TIBC: 299 ug/dL (ref 236–444)
UIBC: 234 ug/dL (ref 120–384)

## 2015-08-19 LAB — FERRITIN CHCC: FERRITIN: 75 ng/mL (ref 9–269)

## 2015-08-19 MED ORDER — ANASTROZOLE 1 MG PO TABS: 1.0000 mg | ORAL_TABLET | Freq: Every day | ORAL | Status: DC

## 2015-08-19 NOTE — Telephone Encounter (Signed)
Pt confirmed labs/ov/inj r/s per 10/04 POF, gave pt AVS and Calendar... KJ, pt will be contacting [facility] to schedule her next MM

## 2015-08-19 NOTE — Progress Notes (Signed)
[place] OFFICE PROGRESS NOTE  CC  Gennette Pac, NP [address] [place] [address] Dr. Neldon Mc Dr. Arloa Koh  DIAGNOSIS: 79 year old female with stage IA invasive ductal carcinoma that is ER positive.   PRIOR THERAPY:  #1 patient was seen in the multidisciplinary breast clinic for a T1 NX MX invasive ductal carcinoma that was ER positive HER-2/neu negative.  #2 patient has gone on to have a Right breast lumpectomy and sentinel node on 02/15/2012. Her final pathology revealed a 1.4 cm invasive cancer one sentinel node was negative for metastatic disease tumor was ER positive.  #3 Oncotype DX was sent her with breast cancer recurrence score is 13. This gives her a 9% average rate of distant recurrence at 5 years with use of a hormonal agent.  #4 patient's case was discussed at the multi-disciplinary breast conference this week. Radiation therapy was not recommended by Dr. Valere Dross. Therefore she is on  adjuvant antiestrogen therapy consisting of Arimidex 1 mg daily. A total of 5 years therapy is planned.  CURRENT THERAPY: Arimidex 1 mg daily starting 03/03/2012.  INTERVAL HISTORY: [name] 79 y.o. female returns for followup visit today. She was seen by me 6 months ago. She has been tolerating Arimidex very well, no significant side effects. She has multiple medical problems, she is going to have a right shoulder surgery, and will follow-up with her cardiologist for surgical clearance. She also has chronic insomnia, has tried many medications, which did not help much. She is morbidly obese, walks around with the walker.  she is currently undergoing outpatient rehabilitation, 3 times a week. She is doing well on that. No other new complaints.  Remainder of the 10 point review of systems is negative.  MEDICAL HISTORY: Past Medical History  Diagnosis Date  . Hypertension   . Fibromyalgia   . PONV (postoperative nausea and vomiting)    . Recurrent upper respiratory infection (URI)     have a cold which is ending  . Arthritis   . Sleep apnea     uses CPAP at night  . High cholesterol   . Restless leg syndrome   . Shortness of breath on exertion   . Type II diabetes mellitus ([place])   . H/O hiatal hernia   . Chronic lower back pain   . Weakness of both hips 06/06/2014  . Abnormality of gait 06/06/2014  . Obesity 06/06/2014  . Cancer [facility])     breast cancer, lumpectomy    ALLERGIES:  is allergic to alcohol.  MEDICATIONS:  Current Outpatient Prescriptions  Medication Sig Dispense Refill  . acetaminophen (TYLENOL) 500 MG tablet Take 1,000 mg by mouth every 6 (six) hours as needed.    Marland Kitchen amLODipine (NORVASC) 5 MG tablet     . anastrozole (ARIMIDEX) 1 MG tablet Take 1 tablet (1 mg total) by mouth daily. 90 tablet 1  . aspirin EC 81 MG tablet Take 81 mg by mouth every evening.    . Blood Glucose Monitoring Suppl (FIFTY50 GLUCOSE METER 2.0) W/DEVICE KIT 1 each by Other route once. Use as instructed to check fasting BS daily.Glucometer ; Reli-On    . Calcium Carbonate-Vit D-Min (CALCIUM 600+D PLUS MINERALS) 600-400 MG-UNIT TABS Take 1,000 mg by mouth daily.    . Cholecalciferol (D 1000) 1000 UNITS capsule Take 1 capsule by mouth daily.    . clindamycin (CLEOCIN) 150 MG capsule   0  . clindamycin (CLEOCIN) 150 MG  capsule Take 150 mg by mouth.    . cyclobenzaprine (FLEXERIL) 10 MG tablet Take 10 mg by mouth 3 (three) times daily as needed for muscle spasms.    . diphenhydrAMINE (BENADRYL) 25 mg capsule Take 25 mg by mouth as needed.    . furosemide (LASIX) 20 MG tablet Take 20 mg by mouth daily.     Marland Kitchen gabapentin (NEURONTIN) 300 MG capsule Take 300 mg by mouth.    Marland Kitchen HYDROcodone-acetaminophen (VICODIN) 5-500 MG per tablet Take 1 tablet by mouth every 6 (six) hours as needed. For pain    . KLOR-CON M20 20 MEQ tablet daily.    . Liraglutide (VICTOZA) 18 MG/3ML SOPN Inject 0.6 mg into the skin as needed.    Marland Kitchen losartan (COZAAR)  100 MG tablet Take 100 mg by mouth.    . Magnesium 250 MG TABS Take 1,000 mg by mouth daily.    . metoprolol tartrate (LOPRESSOR) 25 MG tablet Take 25 mg by mouth 2 (two) times daily.    Marland Kitchen nystatin (MYCOSTATIN) powder Apply 1 g topically 3 (three) times daily as needed. For fungus    . ReliOn Ultra Thin Lancets MISC by Does not apply route.    Marland Kitchen rOPINIRole (REQUIP) 2 MG tablet Take 2 mg by mouth at bedtime. 1 tablet with meals and 3 tablets at bedtime    . solifenacin (VESICARE) 10 MG tablet Take 10 mg by mouth daily.    Marland Kitchen amoxicillin (AMOXIL) 500 MG capsule as needed. Before the dentist    . gabapentin (NEURONTIN) 300 MG capsule   2  . glipiZIDE (GLUCOTROL XL) 5 MG 24 hr tablet Take 5 mg by mouth 2 (two) times daily.  2  . UNABLE TO FIND Uses C-Pap at hs     No current facility-administered medications for this visit.    SURGICAL HISTORY:  Past Surgical History  Procedure Laterality Date  . Mastectomy partial / lumpectomy w/ axillary lymphadenectomy  02/15/12    right  . Tonsillectomy and adenoidectomy  1954  . Appendectomy  1954  . Cholecystectomy  circa 1978    Open - done in [place]  . Vaginal hysterectomy  ~ 1974  . Rotator cuff repair  ~ 2011; ~ 2008    right; left  . Joint replacement    . Replacement total knee bilateral  12/2002; 01/2010    left; right  . Shoulder open rotator cuff repair  10/2007    redo  . Breast cyst excision  1950's    right  . Cataract extraction w/ intraocular lens  implant, bilateral  2012  . Carpal tunnel release  2000's    bilaterally  . Breast lumpectomy      right    REVIEW OF SYSTEMS:  A 10 point review of systems was conducted and is otherwise negative except for what is noted above.    Health Maintenance  Mammogram: 02/14/2015 Colonoscopy:  "a few years back, 10 year f/u recommended" Bone Density Scan: 09/10/2014 Pap Smear: s/p partial hysterectomy Eye Exam: 02/2014 Vitamin D Level: pending Lipid Panel: 07/2014    PHYSICAL  EXAMINATION:  BP 154/70 mmHg  Pulse 101  Temp(Src) 98.3 F (36.8 C) (Oral)  Resp 20  Ht _0  (1.575 m)  Wt 294 lb (133.358 kg)  BMI 53.76 kg/m2  SpO2 96% EXAM LIMITED PATIENT IN CHAIR GENERAL: Patient is a well appearing female in no acute distress HEENT:  Sclerae anicteric.  Oropharynx clear and moist. No ulcerations or evidence of  oropharyngeal candidiasis. Neck is supple.  NODES:  No cervical, supraclavicular, or axillary lymphadenopathy palpated.  BREAST EXAM:  S/p right breast lumpectomy, no masses, or nodules, left breast no masses or nodules, benign bilateral breast exam.  LUNGS:  Clear to auscultation bilaterally.  No wheezes or rhonchi. HEART:  Regular rate and rhythm. No murmur appreciated. ABDOMEN:  Soft, nontender.  Positive, normoactive bowel sounds. No organomegaly palpated. MSK:  No focal spinal tenderness to palpation. Full range of motion bilaterally in the upper extremities. EXTREMITIES:  No peripheral edema.   SKIN:  Clear with no obvious rashes or skin changes. No nail dyscrasia. NEURO:  Nonfocal. Well oriented.  Appropriate affect. ECOG PERFORMANCE STATUS: 1 - Symptomatic but completely ambulatory    LABORATORY DATA: CBC Latest Ref Rng 08/19/2015 02/17/2015 08/15/2014  WBC 3.9 - 10.3 10e3/uL 11.2(H) 10.5(H) 9.8  Hemoglobin 11.6 - 15.9 g/dL 11.6 11.1(L) 11.6  Hematocrit 34.8 - 46.6 % 36.0 35.0 35.9  Platelets 145 - 400 10e3/uL 245 268 259    CMP Latest Ref Rng 08/19/2015 02/17/2015 08/15/2014  Glucose 70 - 140 mg/dl 147(H) 185(H) 122  BUN 7.0 - 26.0 mg/dL 23.1 25.3 18.9  Creatinine 0.6 - 1.1 mg/dL 1.4(H) 1.4(H) 1.6(H)  Sodium 136 - 145 mEq/L 144 141 141  Potassium 3.5 - 5.1 mEq/L 3.9 4.7 4.6  Chloride 98 - 107 mEq/L - - -  CO2 22 - 29 mEq/L _0 Calcium 8.4 - 10.4 mg/dL 10.0 9.3 10.4  Total Protein 6.4 - 8.3 g/dL 6.6 6.5 6.8  Total Bilirubin 0.20 - 1.20 mg/dL 0.67 0.43 0.61  Alkaline Phos 40 - 150 U/L 71 67 60  AST 5 - 34 U/L _1 ALT 0 - 55 U/L  _2 Mammogram on 02/14/2015 IMPRESSION: No evidence of malignancy.  RECOMMENDATION: Bilateral diagnostic mammogram in 1 year.   ASSESSMENT AND PLAN: 79 year old female with  1. stage IA invasive ductal carcinoma of the right breast status post lumpectomy with sentinel node biopsy on 02/15/2012. The final pathology revealed a 1.4 cm invasive cancer that was ER positive HER-2/neu negative. Oncotype DX showed a score of only 13 during her in the low risk category. Patient was therefore recommended antiestrogen therapy only and began taking Arimidex daily in April, 2013.   -She is clinically doing well, no evidence of recurrence by mammogram or physical exam. -She will continue Arimidex, for total 5 years. I refilled it for her today -Her bone density scan was normal last year, she'll continue calcium and vitamin D supplement -She continue screening mammogram once a year -I strongly encouraged her to continue physical therapy, and to be as physically active as she can, and trying to lose some weight. We discussed healthy diet also.  2. Normocytic anemia -She has a mild anemia. Hemoglobin 11.9 today. -I ordered ferritin and iron study, E56 and folic acid acid level today, results are still pending.  3. Diabetes, osteoarthritis, hypertension, OSA -She will continue follow-up with her primary care physician  4. Morbid obesity -We discussed a healthy diet and regular exercise, to help her lose weight.  Follow-up: Return to clinic in 6 months with lab.  I spent 25  minutes counseling the patient face to face. The total time spent in the appointment was 30 minutes.  Truitt Merle  08/19/2015

## 2015-08-19 NOTE — Addendum Note (Signed)
Addended by: Jesse Fall on: 08/19/2015 02:29 PM   Modules accepted: Orders, Medications

## 2015-08-20 LAB — VITAMIN B12: Vitamin B-12: 454 pg/mL (ref 211–911)

## 2015-08-20 LAB — FOLATE RBC: RBC Folate: >1000 ng/mL (ref >280)

## 2015-11-27 ENCOUNTER — Other Ambulatory Visit: Payer: Self-pay | Admitting: Hematology

## 2015-11-27 DIAGNOSIS — Z853 Personal history of malignant neoplasm of breast: Secondary | ICD-10-CM

## 2016-02-17 ENCOUNTER — Ambulatory Visit (HOSPITAL_BASED_OUTPATIENT_CLINIC_OR_DEPARTMENT_OTHER): Payer: Medicare Other | Admitting: Hematology

## 2016-02-17 ENCOUNTER — Telehealth: Payer: Self-pay | Admitting: Hematology

## 2016-02-17 ENCOUNTER — Encounter: Payer: Self-pay | Admitting: Hematology

## 2016-02-17 VITALS — BP 143/55 | HR 80 | Temp 98.4°F | Resp 18 | Ht 62.0 in | Wt 288.0 lb

## 2016-02-17 DIAGNOSIS — C50411 Malignant neoplasm of upper-outer quadrant of right female breast: Secondary | ICD-10-CM | POA: Diagnosis not present

## 2016-02-17 MED ORDER — ANASTROZOLE 1 MG PO TABS: 1.0000 mg | ORAL_TABLET | Freq: Every day | ORAL | Status: DC

## 2016-02-17 NOTE — Progress Notes (Signed)
[place] OFFICE PROGRESS NOTE  CC  Kayla Naomie Dean, NP [place] [address] Dr. Neldon Mc Dr. Arloa Koh  DIAGNOSIS: 80 year old female with stage IA invasive ductal carcinoma that is ER positive.   PRIOR THERAPY:  #1 patient was seen in the multidisciplinary breast clinic for a T1 NX MX invasive ductal carcinoma that was ER positive HER-2/neu negative.  #2 patient has gone on to have a Right breast lumpectomy and sentinel node on 02/15/2012. Her final pathology revealed a 1.4 cm invasive cancer one sentinel node was negative for metastatic disease tumor was ER positive.  #3 Oncotype DX was sent her with breast cancer recurrence score is 13. This gives her a 9% average rate of distant recurrence at 5 years with use of a hormonal agent.  #4 patient's case was discussed at the multi-disciplinary breast conference this week. Radiation therapy was not recommended by Dr. Valere Dross. Therefore she is on  adjuvant antiestrogen therapy consisting of Arimidex 1 mg daily. A total of 5 years therapy is planned.  CURRENT THERAPY: Arimidex 1 mg daily starting 03/03/2012.  INTERVAL HISTORY: [name] 80 y.o. female returns for followup visit today. She has been doing moderatelty well overall. She recently participated a rehab program for weight loss and lost about 20 lbs latley. She still has right shoulder issue, with limited range of motion, and a mild to moderate pain. She also has chronic right hip pain,  uses a cane when she walks. No other new complaints. She is able to function moderately well at home.   MEDICAL HISTORY: Past Medical History  Diagnosis Date  . Hypertension   . Fibromyalgia   . PONV (postoperative nausea and vomiting)   . Recurrent upper respiratory infection (URI)     have a cold which is ending  . Arthritis   . Sleep apnea     uses CPAP at night  . High cholesterol   . Restless leg syndrome   . Shortness of breath on  exertion   . Type II diabetes mellitus ([place])   . H/O hiatal hernia   . Chronic lower back pain   . Weakness of both hips 06/06/2014  . Abnormality of gait 06/06/2014  . Obesity 06/06/2014  . Cancer [facility])     breast cancer, lumpectomy    ALLERGIES:  is allergic to alcohol.  MEDICATIONS:  Current Outpatient Prescriptions  Medication Sig Dispense Refill  . acetaminophen (TYLENOL) 500 MG tablet Take 1,000 mg by mouth every 6 (six) hours as needed.    Marland Kitchen amLODipine (NORVASC) 5 MG tablet     . amoxicillin (AMOXIL) 500 MG capsule as needed. Before the dentist    . anastrozole (ARIMIDEX) 1 MG tablet Take 1 tablet (1 mg total) by mouth daily. 90 tablet 1  . aspirin EC 81 MG tablet Take 81 mg by mouth every evening.    . Blood Glucose Monitoring Suppl (FIFTY50 GLUCOSE METER 2.0) W/DEVICE KIT 1 each by Other route once. Use as instructed to check fasting BS daily.Glucometer ; Reli-On    . Calcium Carbonate-Vit D-Min (CALCIUM 600+D PLUS MINERALS) 600-400 MG-UNIT TABS Take 1,000 mg by mouth daily.    . Cholecalciferol (D 1000) 1000 UNITS capsule Take 1 capsule by mouth daily.    . clindamycin (CLEOCIN) 150 MG capsule   0  . cyclobenzaprine (FLEXERIL) 10 MG tablet Take 10 mg by mouth 3 (three) times daily as needed for muscle spasms.    Marland Kitchen  diphenhydrAMINE (BENADRYL) 25 mg capsule Take 25 mg by mouth as needed.    . furosemide (LASIX) 20 MG tablet Take 20 mg by mouth daily.     Marland Kitchen gabapentin (NEURONTIN) 300 MG capsule   2  . glipiZIDE (GLUCOTROL XL) 5 MG 24 hr tablet Take 5 mg by mouth 2 (two) times daily.  2  . HYDROcodone-acetaminophen (VICODIN) 5-500 MG per tablet Take 1 tablet by mouth every 6 (six) hours as needed. For pain    . KLOR-CON M20 20 MEQ tablet daily.    . Liraglutide (VICTOZA) 18 MG/3ML SOPN Inject 0.6 mg into the skin as needed.    Marland Kitchen losartan (COZAAR) 100 MG tablet Take 100 mg by mouth.    . Magnesium 250 MG TABS Take 1,000 mg by mouth daily.    . metoprolol tartrate (LOPRESSOR) 25 MG  tablet Take 25 mg by mouth 2 (two) times daily.    Marland Kitchen nystatin (MYCOSTATIN) powder Apply 1 g topically 3 (three) times daily as needed. For fungus    . ReliOn Ultra Thin Lancets MISC by Does not apply route.    Marland Kitchen rOPINIRole (REQUIP) 2 MG tablet Take 2 mg by mouth at bedtime. 1 tablet with meals and 3 tablets at bedtime    . solifenacin (VESICARE) 10 MG tablet Take 10 mg by mouth daily.    Marland Kitchen UNABLE TO FIND Uses C-Pap at hs     No current facility-administered medications for this visit.    SURGICAL HISTORY:  Past Surgical History  Procedure Laterality Date  . Mastectomy partial / lumpectomy w/ axillary lymphadenectomy  02/15/12    right  . Tonsillectomy and adenoidectomy  1954  . Appendectomy  1954  . Cholecystectomy  circa 1978    Open - done in [place]  . Vaginal hysterectomy  ~ 1974  . Rotator cuff repair  ~ 2011; ~ 2008    right; left  . Joint replacement    . Replacement total knee bilateral  12/2002; 01/2010    left; right  . Shoulder open rotator cuff repair  10/2007    redo  . Breast cyst excision  1950's    right  . Cataract extraction w/ intraocular lens  implant, bilateral  2012  . Carpal tunnel release  2000's    bilaterally  . Breast lumpectomy      right    REVIEW OF SYSTEMS:  A 10 point review of systems was conducted and is otherwise negative except for what is noted above.    Health Maintenance  Mammogram: 02/14/2015 Colonoscopy:  "a few years back, 10 year f/u recommended" Bone Density Scan: 09/10/2014 Pap Smear: s/p partial hysterectomy Eye Exam: 02/2014 Vitamin D Level: pending Lipid Panel: 07/2014    PHYSICAL EXAMINATION:  BP 143/55 mmHg  Pulse 80  Temp(Src) 98.4 F (36.9 C) (Oral)  Resp 18  Ht _0  (1.575 m)  Wt 288 lb (130.636 kg)  BMI 52.66 kg/m2  SpO2 98% EXAM LIMITED PATIENT IN CHAIR GENERAL: Patient is a well appearing female in no acute distress HEENT:  Sclerae anicteric.  Oropharynx clear and moist. No ulcerations or evidence of  oropharyngeal candidiasis. Neck is supple.  NODES:  No cervical, supraclavicular, or axillary lymphadenopathy palpated.  BREAST EXAM:  S/p right breast lumpectomy, no masses, or nodules, left breast no masses or nodules, benign bilateral breast exam.  LUNGS:  Clear to auscultation bilaterally.  No wheezes or rhonchi. HEART:  Regular rate and rhythm. No murmur appreciated. ABDOMEN:  Soft,  nontender.  Positive, normoactive bowel sounds. No organomegaly palpated. MSK:  No focal spinal tenderness to palpation. Full range of motion bilaterally in the upper extremities. EXTREMITIES:  No peripheral edema.   SKIN:  Clear with no obvious rashes or skin changes. No nail dyscrasia. NEURO:  Nonfocal. Well oriented.  Appropriate affect. ECOG PERFORMANCE STATUS: 1 - Symptomatic but completely ambulatory    LABORATORY DATA: CBC Latest Ref Rng 08/19/2015 02/17/2015 08/15/2014  WBC 3.9 - 10.3 10e3/uL 11.2(H) 10.5(H) 9.8  Hemoglobin 11.6 - 15.9 g/dL 11.6 11.1(L) 11.6  Hematocrit 34.8 - 46.6 % 36.0 35.0 35.9  Platelets 145 - 400 10e3/uL 245 268 259    CMP Latest Ref Rng 08/19/2015 02/17/2015 08/15/2014  Glucose 70 - 140 mg/dl 147(H) 185(H) 122  BUN 7.0 - 26.0 mg/dL 23.1 25.3 18.9  Creatinine 0.6 - 1.1 mg/dL 1.4(H) 1.4(H) 1.6(H)  Sodium 136 - 145 mEq/L 144 141 141  Potassium 3.5 - 5.1 mEq/L 3.9 4.7 4.6  CO2 22 - 29 mEq/L _0 Calcium 8.4 - 10.4 mg/dL 10.0 9.3 10.4  Total Protein 6.4 - 8.3 g/dL 6.6 6.5 6.8  Total Bilirubin 0.20 - 1.20 mg/dL 0.67 0.43 0.61  Alkaline Phos 40 - 150 U/L 71 67 60  AST 5 - 34 U/L _1 ALT 0 - 55 U/L _2 Her outside lab from 12/26/2015  CBC: WBC 9.4, hemoglobin 11.5, platelet 250 K BMP within normal limits except glucose 131, creatinine 1.04 TSH 1.78 Hemoglobin A1c 7%  Mammogram on 02/14/2015 IMPRESSION: No evidence of malignancy.  RECOMMENDATION: Bilateral diagnostic mammogram in 1 year.   ASSESSMENT AND PLAN: 80 year old female with  1. stage IA  invasive ductal carcinoma, ER positive HER-2/neu negative.   -She is clinically doing well, no evidence of recurrence by mammogram or physical exam. -She will continue Arimidex, for total 5 years. I refilled it for her today -Her bone density scan was normal in 2015, she'll continue calcium and vitamin D supplement -She continue screening mammogram once a year, she is overdue, I placed an order for her today -I strongly encouraged her to continue physical therapy, and to be as physically active as she can, and trying to lose some weight. We discussed healthy diet also.  2. Normocytic anemia -She has a mild anemia. Hemoglobin 11-12, stable overall -will check ferritin and iron study, Z61 and folic acid acid level on her next blood draw   3. Diabetes, osteoarthritis, hypertension, OSA -She will continue follow-up with her primary care physician  4. Morbid obesity -She has had 20 pounds weight loss since she participated weight loss rehab program, I encourage her to continue   Plan:  -Continue Arimidex.  -Return to clinic in 6 months with lab.  -Mammogram in the next month. I'll order bone density scan on her next visit.  I spent 25  minutes counseling the patient face to face. The total time spent in the appointment was 30 minutes.  Truitt Merle  02/17/2016

## 2016-02-17 NOTE — Telephone Encounter (Signed)
appt made and avs will print after visit °

## 2016-02-17 NOTE — Telephone Encounter (Signed)
Gave and printed appt sched and avs fo rpt April and OCT

## 2016-02-24 ENCOUNTER — Ambulatory Visit
Admission: RE | Admit: 2016-02-24 | Discharge: 2016-02-24 | Disposition: A | Discharge status: Home / Self Care | Payer: Medicare Other | Source: Ambulatory Visit | Attending: Hematology | Admitting: Hematology

## 2016-02-24 DIAGNOSIS — C50411 Malignant neoplasm of upper-outer quadrant of right female breast: Secondary | ICD-10-CM

## 2016-06-08 ENCOUNTER — Other Ambulatory Visit: Payer: Self-pay | Admitting: Hematology

## 2016-08-10 ENCOUNTER — Telehealth: Payer: Self-pay | Admitting: Hematology

## 2016-08-10 NOTE — Telephone Encounter (Signed)
10/10 Appointments canceled per patient request. Pt. Called to cancel appointments per pt. Is having surgery that day.

## 2016-08-18 ENCOUNTER — Ambulatory Visit: Payer: Medicare Other | Admitting: Hematology

## 2016-08-18 ENCOUNTER — Other Ambulatory Visit: Payer: Medicare Other

## 2016-08-24 ENCOUNTER — Ambulatory Visit: Payer: Medicare Other | Admitting: Hematology

## 2016-08-24 ENCOUNTER — Other Ambulatory Visit: Payer: Medicare Other

## 2016-09-28 ENCOUNTER — Other Ambulatory Visit: Payer: Self-pay | Admitting: Hematology

## 2017-04-02 ENCOUNTER — Other Ambulatory Visit: Payer: Self-pay | Admitting: Hematology

## 2017-04-04 ENCOUNTER — Telehealth: Payer: Self-pay | Admitting: *Deleted

## 2017-04-04 NOTE — Telephone Encounter (Signed)
Called pt & informed of need for appt before next refill on anastrozole.  Pt reports having surgery @ Oct & again in March.  She wants to r/s & she has @ 2 wks of her med left.  Scheduling message sent.

## 2017-04-05 ENCOUNTER — Other Ambulatory Visit: Payer: Self-pay | Admitting: Hematology

## 2017-04-05 DIAGNOSIS — Z853 Personal history of malignant neoplasm of breast: Secondary | ICD-10-CM

## 2017-04-15 ENCOUNTER — Ambulatory Visit
Admission: RE | Admit: 2017-04-15 | Discharge: 2017-04-15 | Disposition: A | Discharge status: Home / Self Care | Payer: Medicare Other | Source: Ambulatory Visit | Attending: Hematology | Admitting: Hematology

## 2017-04-15 DIAGNOSIS — Z853 Personal history of malignant neoplasm of breast: Secondary | ICD-10-CM

## 2017-04-25 ENCOUNTER — Other Ambulatory Visit: Payer: Medicare Other

## 2017-04-25 ENCOUNTER — Ambulatory Visit: Payer: Medicare Other | Admitting: Hematology

## 2017-04-27 ENCOUNTER — Telehealth: Payer: Self-pay | Admitting: Hematology

## 2017-04-27 NOTE — Telephone Encounter (Signed)
Returned call to patient re rescheduling 6/11 lab/fu. Gave patient 1st available to 7/11 due to full schedule and provider PAL.

## 2017-04-28 ENCOUNTER — Ambulatory Visit: Payer: Medicare Other | Admitting: Hematology

## 2017-04-28 ENCOUNTER — Other Ambulatory Visit: Payer: Medicare Other

## 2017-05-24 NOTE — Progress Notes (Signed)
[facility] OFFICE PROGRESS NOTE  CC  Kayla Bott, MD [place] [address] Dr[address] Neldon Mc Dr. Arloa Koh  DIAGNOSIS: 81 year old female with stage IA invasive ductal carcinoma that is ER positive.   PRIOR THERAPY:  #1 patient was seen in the multidisciplinary breast clinic for a T1 NX MX invasive ductal carcinoma that was ER positive HER-2/neu negative.  #2 patient has gone on to have a Right breast lumpectomy and sentinel node on 02/15/2012. Her final pathology revealed a 1.4 cm invasive cancer one sentinel node was negative for metastatic disease tumor was ER positive.  #3 Oncotype DX was sent her with breast cancer recurrence score is 13. This gives her a 9% average rate of distant recurrence at 5 years with use of a hormonal agent.  #4 patient's case was discussed at the multi-disciplinary breast conference this week. Radiation therapy was not recommended by Dr. Valere Dross. Therefore she is on  adjuvant antiestrogen therapy consisting of Arimidex 1 mg daily. A total of 5 years therapy is planned.  CURRENT THERAPY: Surveillance    INTERVAL HISTORY:  [name] 81 y.o. female returns for followup visit today. She presents to the clinic today. She reports to having bilateral shoulder surgery. The left shoulder was half way replaced. She feels better now and almost back to normal.  She denies any other joint problems.  She has continued with Anastrozole and is ready to stop. Overall she is doing well.    MEDICAL HISTORY: Past Medical History:  Diagnosis Date  . Abnormality of gait 06/06/2014  . Arthritis   . Cancer [facility])    breast cancer, lumpectomy  . Chronic lower back pain   . Fibromyalgia   . H/O hiatal hernia   . High cholesterol   . Hypertension   . Obesity 06/06/2014  . PONV (postoperative nausea and vomiting)   . Recurrent upper respiratory infection (URI)    have a cold which is ending  . Restless leg syndrome   .  Shortness of breath on exertion   . Sleep apnea    uses CPAP at night  . Type II diabetes mellitus ([place])   . Weakness of both hips 06/06/2014    ALLERGIES:  is allergic to tape and alcohol.  MEDICATIONS:  Current Outpatient Prescriptions  Medication Sig Dispense Refill  . acetaminophen (TYLENOL) 500 MG tablet Take 1,000 mg by mouth every 6 (six) hours as needed.    Marland Kitchen amLODipine (NORVASC) 5 MG tablet     . anastrozole (ARIMIDEX) 1 MG tablet TAKE 1 TABLET BY MOUTH  DAILY 90 tablet 0  . aspirin EC 81 MG tablet Take 81 mg by mouth every evening.    . Blood Glucose Monitoring Suppl (FIFTY50 GLUCOSE METER 2.0) W/DEVICE KIT 1 each by Other route once. Use as instructed to check fasting BS daily.Glucometer ; Reli-On    . Calcium Carbonate-Vit D-Min (CALCIUM 600+D PLUS MINERALS) 600-400 MG-UNIT TABS Take 1,000 mg by mouth daily.    . Cholecalciferol (D 1000) 1000 UNITS capsule Take 1 capsule by mouth daily.    . clindamycin (CLEOCIN) 150 MG capsule   0  . glipiZIDE (GLUCOTROL XL) 5 MG 24 hr tablet Take 5 mg by mouth 2 (two) times daily.  2  . Magnesium 250 MG TABS Take 1,000 mg by mouth daily.    . metoprolol tartrate (LOPRESSOR) 25 MG tablet Take 25 mg by mouth 2 (two) times daily.    Marland Kitchen nystatin (MYCOSTATIN)  powder Apply 1 g topically 3 (three) times daily as needed. For fungus    . ReliOn Ultra Thin Lancets MISC by Does not apply route.    Marland Kitchen UNABLE TO FIND Uses C-Pap at hs    . amoxicillin (AMOXIL) 500 MG capsule as needed. Before the dentist    . losartan (COZAAR) 100 MG tablet Take 100 mg by mouth.     No current facility-administered medications for this visit.     SURGICAL HISTORY:  Past Surgical History:  Procedure Laterality Date  . APPENDECTOMY  1954  . BREAST BIOPSY Right 2013  . BREAST CYST EXCISION  1950's   right  . BREAST EXCISIONAL BIOPSY Right 1998  . BREAST EXCISIONAL BIOPSY Right 1997  . BREAST LUMPECTOMY     right  . CARPAL TUNNEL RELEASE  2000's   bilaterally  .  CATARACT EXTRACTION W/ INTRAOCULAR LENS  IMPLANT, BILATERAL  2012  . CHOLECYSTECTOMY  circa 1978   Open - done in [place]  . JOINT REPLACEMENT    . MASTECTOMY PARTIAL / LUMPECTOMY W/ AXILLARY LYMPHADENECTOMY  02/15/12   right  . REPLACEMENT TOTAL KNEE BILATERAL  12/2002; 01/2010   left; right  . ROTATOR CUFF REPAIR  ~ 2011; ~ 2008   right; left  . SHOULDER OPEN ROTATOR CUFF REPAIR  10/2007   redo  . [place]  . VAGINAL HYSTERECTOMY  ~ 1974    REVIEW OF SYSTEMS:  A 10 point review of systems was conducted and is otherwise negative except for what is noted above.    Health Maintenance  Mammogram: 04/15/2017 Colonoscopy:  "a few years back, 10 year f/u recommended" Bone Density Scan: 09/10/2014 Pap Smear: s/p partial hysterectomy Eye Exam: 02/2014 Vitamin D Level: pending Lipid Panel: 07/2014    PHYSICAL EXAMINATION:  Vitals:   05/25/17 1401  BP: (!) 164/62  Pulse: 86  Resp: 18  Temp: 98.1 F (36.7 C)  TempSrc: Oral  SpO2: 96%  Weight: 275 lb 8 oz (125 kg)  Height: _0  (1.575 m)    EXAM LIMITED PATIENT IN CHAIR   GENERAL: Patient is a well appearing female in no acute distress HEENT:  Sclerae anicteric.  Oropharynx clear and moist. No ulcerations or evidence of oropharyngeal candidiasis. Neck is supple.  NODES:  No cervical, supraclavicular, or axillary lymphadenopathy palpated.  BREAST EXAM:  S/p right breast lumpectomy, no masses, or nodules, left breast no masses or nodules, benign bilateral breast exam.  LUNGS:  Clear to auscultation bilaterally.  No wheezes or rhonchi. HEART:  Regular rate and rhythm. No murmur appreciated. ABDOMEN:  Soft, nontender.  Positive, normoactive bowel sounds. No organomegaly palpated. MSK:  No focal spinal tenderness to palpation. Full range of motion bilaterally in the upper extremities. EXTREMITIES:  No peripheral edema.   SKIN:  Clear with no obvious rashes or skin changes. No nail dyscrasia. NEURO:   Nonfocal. Well oriented.  Appropriate affect. ECOG PERFORMANCE STATUS: 1 - Symptomatic but completely ambulatory    LABORATORY DATA: CBC Latest Ref Rng & Units 05/25/2017 08/19/2015 02/17/2015  WBC 3.9 - 10.3 10e3/uL 10.1 11.2(H) 10.5(H)  Hemoglobin 11.6 - 15.9 g/dL 14.1 11.6 11.1(L)  Hematocrit 34.8 - 46.6 % 43.9 36.0 35.0  Platelets 145 - 400 10e3/uL 205 245 268    CMP Latest Ref Rng & Units 05/25/2017 08/19/2015 02/17/2015  Glucose 70 - 140 mg/dl 143(H) 147(H) 185(H)  BUN 7.0 - 26.0 mg/dL 39.8(H) 23.1 25.3  Creatinine 0.6 - 1.1 mg/dL 1.2(H)  1.4(H) 1.4(H)  Sodium 136 - 145 mEq/L 145 144 141  Potassium 3.5 - 5.1 mEq/L 5.0 3.9 4.7  Chloride 98 - 107 mEq/L - - -  CO2 22 - 29 mEq/L _0 Calcium 8.4 - 10.4 mg/dL 11.5(H) 10.0 9.3  Total Protein 6.4 - 8.3 g/dL 7.5 6.6 6.5  Total Bilirubin 0.20 - 1.20 mg/dL 0.43 0.67 0.43  Alkaline Phos 40 - 150 U/L 56 71 67  AST 5 - 34 U/L _1 ALT 0 - 55 U/L _2 Her outside lab from 12/26/2015  CBC: WBC 9.4, hemoglobin 11.5, platelet 250 K BMP within normal limits except glucose 131, creatinine 1.04 TSH 1.78 Hemoglobin A1c 7%  Mammogram 04/15/17 IMPRESSION: No evidence of malignancy.  RECOMMENDATION: Bilateral diagnostic mammogram in 1 year.  Mammogram on 02/14/2015 IMPRESSION: No evidence of malignancy. RECOMMENDATION: Bilateral diagnostic mammogram in 1 year.   ASSESSMENT AND PLAN: 81 y.o.  female with  1. stage IA invasive ductal carcinoma, ER positive HER-2/neu negative.   -She is clinically doing well, no evidence of recurrence by mammogram or physical exam. -She Has completed 5 years of adjuvant anastrozole, we'll stop now.  -Her bone density scan was normal in 2015, she'll continue calcium and vitamin D supplement -She continue screening mammogram once a year, she is overdue, I placed an order for her today -I strongly encouraged her to continue physical therapy, and to be as physically active as she can, and trying  to lose some weight. We discussed healthy diet also. -labs reviewed; anemia resolved and her Cr is slightly elevated. I encourage her to drink more water to stay hydrated. Her last mammogram was normal. She is clinically doing well.  -We discussed that breast cancer can still occur after 5-10 years, although the risk is small in her. We reviewed the signs of recurrence. I encouraged to continue with regular mammograms, breast exams and seeing her PCP. She does not have to follow up with me unless she would like to.   2. Normocytic anemia -She has a mild anemia. Hemoglobin 11-12, stable overall -will check ferritin and iron study, O77 and folic acid acid level on her next blood draw  -She has been taking multi-vitamin, Anemia resolved now  3. Diabetes, osteoarthritis, hypertension, OSA -She will continue follow-up with her primary care physician  4. Morbid obesity -She has had 20 pounds weight loss since she participated weight loss rehab program, I encourage her to continue   Plan: -She has completed 5 years of adjuvant anastrozole, we'll stop -She will follow-up with her primary care physician in the future. She will only see me as needed   I spent 20  minutes counseling the patient face to face. The total time spent in the appointment was 25  minutes.  Truitt Merle  05/25/2017

## 2017-05-25 ENCOUNTER — Other Ambulatory Visit (HOSPITAL_BASED_OUTPATIENT_CLINIC_OR_DEPARTMENT_OTHER): Payer: Medicare Other

## 2017-05-25 ENCOUNTER — Ambulatory Visit (HOSPITAL_BASED_OUTPATIENT_CLINIC_OR_DEPARTMENT_OTHER): Payer: Medicare Other | Admitting: Hematology

## 2017-05-25 VITALS — BP 164/62 | HR 86 | Temp 98.1°F | Resp 18 | Ht 62.0 in | Wt 275.5 lb

## 2017-05-25 DIAGNOSIS — Z79811 Long term (current) use of aromatase inhibitors: Secondary | ICD-10-CM | POA: Diagnosis not present

## 2017-05-25 DIAGNOSIS — C50411 Malignant neoplasm of upper-outer quadrant of right female breast: Secondary | ICD-10-CM

## 2017-05-25 DIAGNOSIS — I10 Essential (primary) hypertension: Secondary | ICD-10-CM | POA: Diagnosis not present

## 2017-05-25 DIAGNOSIS — C50911 Malignant neoplasm of unspecified site of right female breast: Secondary | ICD-10-CM

## 2017-05-25 DIAGNOSIS — D649 Anemia, unspecified: Secondary | ICD-10-CM | POA: Diagnosis not present

## 2017-05-25 DIAGNOSIS — E119 Type 2 diabetes mellitus without complications: Secondary | ICD-10-CM | POA: Diagnosis not present

## 2017-05-25 DIAGNOSIS — Z170 Estrogen receptor positive status [ER+]: Secondary | ICD-10-CM

## 2017-05-25 LAB — CBC & DIFF AND RETIC
BASO%: 0.2 % (ref 0.0–2.0)
BASOS ABS: 0 10*3/uL (ref 0.0–0.1)
EOS ABS: 0.1 10*3/uL (ref 0.0–0.5)
EOS%: 0.9 % (ref 0.0–7.0)
HEMATOCRIT: 43.9 % (ref 34.8–46.6)
HGB: 14.1 g/dL (ref 11.6–15.9)
Immature Retic Fract: 7.9 % (ref 1.60–10.00)
LYMPH#: 1.6 10*3/uL (ref 0.9–3.3)
LYMPH%: 15.6 % (ref 14.0–49.7)
MCH: 27.4 pg (ref 25.1–34.0)
MCHC: 32.1 g/dL (ref 31.5–36.0)
MCV: 85.2 fL (ref 79.5–101.0)
MONO#: 0.7 10*3/uL (ref 0.1–0.9)
MONO%: 7.3 % (ref 0.0–14.0)
NEUT%: 76 % (ref 38.4–76.8)
NEUTROS ABS: 7.7 10*3/uL — AB (ref 1.5–6.5)
Platelets: 205 10*3/uL (ref 145–400)
RBC: 5.15 10*6/uL (ref 3.70–5.45)
RDW: 16.7 % — ABNORMAL HIGH (ref 11.2–14.5)
RETIC CT ABS: 96.82 10*3/uL — AB (ref 33.70–90.70)
Retic %: 1.88 % (ref 0.70–2.10)
WBC: 10.1 10*3/uL (ref 3.9–10.3)

## 2017-05-25 LAB — COMPREHENSIVE METABOLIC PANEL
ALT: 17 U/L (ref 0–55)
AST: 16 U/L (ref 5–34)
Albumin: 4.2 g/dL (ref 3.5–5.0)
Alkaline Phosphatase: 56 U/L (ref 40–150)
Anion Gap: 13 mEq/L — ABNORMAL HIGH (ref 3–11)
BUN: 39.8 mg/dL — ABNORMAL HIGH (ref 7.0–26.0)
CALCIUM: 11.5 mg/dL — AB (ref 8.4–10.4)
CO2: 24 meq/L (ref 22–29)
CREATININE: 1.2 mg/dL — AB (ref 0.6–1.1)
Chloride: 108 mEq/L (ref 98–109)
EGFR: 43 mL/min/{1.73_m2} — ABNORMAL LOW (ref >90)
GLUCOSE: 143 mg/dL — AB (ref 70–140)
POTASSIUM: 5 meq/L (ref 3.5–5.1)
SODIUM: 145 meq/L (ref 136–145)
Total Bilirubin: 0.43 mg/dL (ref 0.20–1.20)
Total Protein: 7.5 g/dL (ref 6.4–8.3)

## 2017-05-29 ENCOUNTER — Encounter: Payer: Self-pay | Admitting: Hematology

## 2021-03-15 DEATH — deceased
